# Patient Record
Sex: Male | Born: 1935 | Hispanic: No | Smoking: Former smoker
Health system: Southern US, Community
[De-identification: ages and names within clinical notes are randomized; demographics above are authoritative.]

## PROBLEM LIST (undated history)

## (undated) DIAGNOSIS — I10 Essential (primary) hypertension: Secondary | ICD-10-CM

## (undated) DIAGNOSIS — E785 Hyperlipidemia, unspecified: Secondary | ICD-10-CM

## (undated) DIAGNOSIS — M1990 Unspecified osteoarthritis, unspecified site: Secondary | ICD-10-CM

## (undated) DIAGNOSIS — K219 Gastro-esophageal reflux disease without esophagitis: Secondary | ICD-10-CM

## (undated) DIAGNOSIS — I739 Peripheral vascular disease, unspecified: Secondary | ICD-10-CM

## (undated) DIAGNOSIS — I6529 Occlusion and stenosis of unspecified carotid artery: Secondary | ICD-10-CM

## (undated) DIAGNOSIS — Z8673 Personal history of transient ischemic attack (TIA), and cerebral infarction without residual deficits: Secondary | ICD-10-CM

## (undated) HISTORY — DX: Gastro-esophageal reflux disease without esophagitis: K21.9

## (undated) HISTORY — DX: Hyperlipidemia, unspecified: E78.5

## (undated) HISTORY — DX: Essential (primary) hypertension: I10

## (undated) HISTORY — DX: Peripheral vascular disease, unspecified: I73.9

## (undated) HISTORY — DX: Personal history of transient ischemic attack (TIA), and cerebral infarction without residual deficits: Z86.73

## (undated) HISTORY — PX: APPENDECTOMY: SHX54

## (undated) HISTORY — DX: Occlusion and stenosis of unspecified carotid artery: I65.29

## (undated) HISTORY — PX: EYE SURGERY: SHX253

---

## 2006-02-18 HISTORY — PX: US ECHOCARDIOGRAPHY: HXRAD669

## 2006-06-07 HISTORY — PX: CARDIOVASCULAR STRESS TEST: SHX262

## 2008-01-03 ENCOUNTER — Ambulatory Visit: Payer: Self-pay | Admitting: Vascular Surgery

## 2009-01-10 ENCOUNTER — Ambulatory Visit: Payer: Self-pay | Admitting: Vascular Surgery

## 2009-07-11 ENCOUNTER — Ambulatory Visit: Payer: Self-pay | Admitting: Vascular Surgery

## 2010-07-16 ENCOUNTER — Other Ambulatory Visit (INDEPENDENT_AMBULATORY_CARE_PROVIDER_SITE_OTHER): Payer: Medicare Other

## 2010-07-16 DIAGNOSIS — I6529 Occlusion and stenosis of unspecified carotid artery: Secondary | ICD-10-CM

## 2010-07-23 NOTE — Procedures (Unsigned)
CAROTID DUPLEX EXAM  INDICATION:  Follow up right carotid disease, known left ICA occlusion.  HISTORY: Diabetes:  No. Cardiac:  No. Hypertension:  Yes. Smoking:  No. Previous Surgery:  No. CV History:  No. Amaurosis Fugax No, Paresthesias No, Hemiparesis No                                      RIGHT             LEFT Brachial systolic pressure:         128               132 Brachial Doppler waveforms:         WNL               WNL Vertebral direction of flow:        Antegrade DUPLEX VELOCITIES (cm/sec) CCA peak systolic                   133 ECA peak systolic                   204 ICA peak systolic                   90 ICA end diastolic                   25 PLAQUE MORPHOLOGY:                  Heterogenous PLAQUE AMOUNT:                      Mild PLAQUE LOCATION:                    ICA/ECA  IMPRESSION: 1. 1-39% right ICA plaquing. 2. Antegrade right vertebral artery. 3. Abnormal right ECA. 4. Left side is not evaluated due to known occlusion. 5. Stable velocities compared to prior exam.  ___________________________________________ Di Kindle. Edilia Bo, M.D.  LT/MEDQ  D:  07/16/2010  T:  07/16/2010  Job:  [id number]

## 2010-09-30 NOTE — Assessment & Plan Note (Signed)
OFFICE VISIT   BRAXTEN, MEMMER  DOB:  12-Aug-1935                                       01/03/2008  ZOXWR#:[id number]   I saw the patient in the office today for continued followup of his  carotid disease.  I had originally seen him back in November of 2007  with an occluded left internal carotid artery and no significant disease  on the right.  He had a followup carotid duplex scan today which shows  again an occluded left internal carotid artery and 20-39% right internal  carotid artery stenosis.  Both vertebral arteries are patent with  normally directed flow and his arm pressures are equal.   He has had no history of stroke, TIAs, expressive or receptive aphasia,  or amaurosis fugax.   PAST MEDICAL HISTORY:  Has not changed since I saw him in 2007.  He does  have a history of hypertension and hypercholesterolemia.   REVIEW OF SYSTEMS:  He has had no recent chest pain, chest pressure,  palpitations or arrhythmias.  He had no bronchitis, asthma or wheezing.   He quit tobacco in 1985.   PHYSICAL EXAMINATION:  This is a pleasant 75 year old gentleman who  appears his stated age.  Blood pressure 139/76, heart rate is 79.  I do  not detect any carotid bruits.  Lungs are clear bilaterally to  auscultation.  On cardiac exam, he has a regular rate and rhythm.  Abdomen:  Soft and nontender.  Neurologic exam is nonfocal.   I have again explained we would not consider right carotid  endarterectomy unless this became symptomatic or the stenosis progressed  to greater than 80%.  I have recommend a followup duplex scan in 1 year.  I will see him at that time.  He knows to call sooner if he has  problems.  In the meantime he remains on aspirin.   Di Kindle. Edilia Bo, M.D.  Electronically Signed   CSD/MEDQ  D:  01/03/2008  T:  01/04/2008  Job:  [id number]   cc:   Elmore Guise., M.D.

## 2010-09-30 NOTE — Procedures (Signed)
CAROTID DUPLEX EXAM   INDICATION:  Follow up known carotid artery disease.   HISTORY:  Diabetes:  No  Cardiac:  No  Hypertension:  Yes  Smoking:  No  Previous Surgery:  No  CV History:  No  Amaurosis Fugax No, Paresthesias No, Hemiparesis No                                       RIGHT             LEFT  Brachial systolic pressure:         150               140  Brachial Doppler waveforms:         biphasic          biphasic  Vertebral direction of flow:        antegrade         antegrade  DUPLEX VELOCITIES (cm/sec)  CCA peak systolic                   110               91  ECA peak systolic                   216               139  ICA peak systolic                   147               occluded  ICA end diastolic                   33                occluded  PLAQUE MORPHOLOGY:                  calcified         calcified  PLAQUE AMOUNT:                      moderate          severe  PLAQUE LOCATION:                    ICA and ECA       ICA and ECA   IMPRESSION:  1. Occluded left internal carotid artery.  2. A 40-59% stenosis noted in the right internal carotid artery.  3. Antegrade bilateral vertebral arteries.   ___________________________________________  Di Kindle. Edilia Bo, M.D.   MG/MEDQ  D:  01/10/2009  T:  01/11/2009  Job:  [id number]

## 2010-09-30 NOTE — Assessment & Plan Note (Signed)
OFFICE VISIT   Jeffery Suarez, Jeffery Suarez  DOB:  1935-06-07                                       01/10/2009  ZOXWR#:[id number]   I saw the patient in the office today for continued follow-up of his  carotid disease.  I had originally seen him back in 2007 with an  occluded left internal carotid artery.  I have been following a moderate  right carotid stenosis.  Since I saw him last a year ago he denies any  history of stroke, TIAs, expressive or receptive aphasia, or amaurosis  fugax.   On review of systems, he has had no recent chest pain, chest pressure,  palpitations or arrhythmias.  He has had no productive cough,  bronchitis, asthma or wheezing.  Review of systems is otherwise  documented on the medical history form in his chart.   On physical examination, this is a pleasant 75 year old gentleman who  appears his stated age, blood pressure is 143/78, heart rate is 74.  I  do not detect any carotid bruits.  Lungs:  Are clear bilaterally to  auscultation.  On cardiac exam, he has a regular rate and rhythm.  Neurologic exam:  Is nonfocal.   His carotid duplex scan shows that he has a known left internal carotid  artery occlusion.  On the right side he has a 40% to 59% stenosis.  The  velocities on the right have increased slightly compared to the study a  year ago.  Both vertebral arteries are patent with normally-directed  flow.   Given that the velocities have increased on the right and given the left  internal carotid artery occlusion, I think it would be safest to check  his carotid duplex again in 6 months.  I will plan on seeing him back at  that time.  He knows to call sooner if he has problems.  In the  meantime, he knows to continue taking his aspirin.   Di Kindle. Edilia Bo, M.D.  Electronically Signed   CSD/MEDQ  D:  01/10/2009  T:  01/11/2009  Job:  [id number]

## 2010-09-30 NOTE — Procedures (Signed)
CAROTID DUPLEX EXAM   INDICATION:  Follow-up evaluation of known carotid artery disease.   HISTORY:  Diabetes:  No.  Cardiac:  No.  Hypertension:  Yes.  Smoking:  No.  Previous Surgery:  No.  CV History:  Patient had a TIA characterized by loss of vision in her  left eye in 2007.  Previous duplex performed 03/08/06 revealed occluded  left internal carotid arteries and 1-39% right internal carotid artery  stenosis.  Amaurosis Fugax No, Paresthesias No hemiparesis No.                                       RIGHT             LEFT  Brachial systolic pressure:         146               146  Brachial Doppler waveforms:         Triphasic         Triphasic  Vertebral direction of flow:        Antegrade         Antegrade  DUPLEX VELOCITIES (cm/sec)  CCA peak systolic                   88                61  ECA peak systolic                   211               128  ICA peak systolic                   89                Occluded  ICA end diastolic                   23                Occluded  PLAQUE MORPHOLOGY:                  Calcified         Mixed  PLAQUE AMOUNT:                      Mild              Severe  PLAQUE LOCATION:                    Proximal ICA      Throughout ICA   IMPRESSION:  1. 20-39% right internal carotid artery stenosis.  2. Occluded left internal carotid artery.  3. No significant change from previous study performed on 03/08/06.   ___________________________________________  Di Kindle. Edilia Bo, M.D.   MC/MEDQ  D:  01/03/2008  T:  01/03/2008  Job:  [id number]

## 2010-09-30 NOTE — Procedures (Signed)
CAROTID DUPLEX EXAM   INDICATION:  Followup known carotid artery disease.   HISTORY:  Diabetes:  No.  Cardiac:  No.  Hypertension:  Yes.  Smoking:  No.  Previous Surgery:  No.  CV History:  No.  Amaurosis Fugax No, Paresthesias No, Hemiparesis No                                       RIGHT             LEFT  Brachial systolic pressure:         130               130  Brachial Doppler waveforms:         Biphasic          Biphasic  Vertebral direction of flow:        Antegrade         Antegrade  DUPLEX VELOCITIES (cm/sec)  CCA peak systolic                   122               121  ECA peak systolic                   297               143  ICA peak systolic                   131               Occluded  ICA end diastolic                   33                Occluded  PLAQUE MORPHOLOGY:                  Calcified         Calcified  PLAQUE AMOUNT:                      Mild              Severe  PLAQUE LOCATION:                    ICA and ECA       ICA and ECA   IMPRESSION:  1. Known left internal carotid artery occlusion.  2. 40%-59% stenosis noted in the right internal carotid artery.  3. Antegrade bilateral vertebrals.   ___________________________________________  Di Kindle. Edilia Bo, M.D.   MG/MEDQ  D:  07/11/2009  T:  07/11/2009  Job:  [id number]

## 2010-10-28 ENCOUNTER — Other Ambulatory Visit: Payer: Self-pay | Admitting: Cardiovascular Disease

## 2010-10-28 DIAGNOSIS — E785 Hyperlipidemia, unspecified: Secondary | ICD-10-CM

## 2010-10-28 NOTE — Telephone Encounter (Signed)
Fax received from pharmacy. Refill completed. Jodette Jarion Hawthorne RN  

## 2010-10-29 ENCOUNTER — Other Ambulatory Visit: Payer: Self-pay | Admitting: Cardiovascular Disease

## 2010-10-29 NOTE — Telephone Encounter (Signed)
Needs yearly soon, noted on refill #90 no refill

## 2010-12-09 ENCOUNTER — Encounter: Payer: Self-pay | Admitting: Cardiovascular Disease

## 2010-12-11 ENCOUNTER — Other Ambulatory Visit: Payer: Self-pay | Admitting: *Deleted

## 2010-12-11 ENCOUNTER — Encounter: Payer: Self-pay | Admitting: Cardiovascular Disease

## 2010-12-11 ENCOUNTER — Ambulatory Visit (INDEPENDENT_AMBULATORY_CARE_PROVIDER_SITE_OTHER): Payer: Medicare Other | Admitting: Cardiovascular Disease

## 2010-12-11 ENCOUNTER — Other Ambulatory Visit (INDEPENDENT_AMBULATORY_CARE_PROVIDER_SITE_OTHER): Payer: Medicare Other | Admitting: *Deleted

## 2010-12-11 VITALS — BP 152/80 | HR 70 | Ht 70.0 in | Wt 239.2 lb

## 2010-12-11 DIAGNOSIS — E785 Hyperlipidemia, unspecified: Secondary | ICD-10-CM

## 2010-12-11 DIAGNOSIS — G459 Transient cerebral ischemic attack, unspecified: Secondary | ICD-10-CM | POA: Insufficient documentation

## 2010-12-11 DIAGNOSIS — I739 Peripheral vascular disease, unspecified: Secondary | ICD-10-CM | POA: Insufficient documentation

## 2010-12-11 DIAGNOSIS — I10 Essential (primary) hypertension: Secondary | ICD-10-CM

## 2010-12-11 LAB — HEPATIC FUNCTION PANEL
AST: 27 U/L (ref 0–37)
Albumin: 4.6 g/dL (ref 3.5–5.2)

## 2010-12-11 LAB — BASIC METABOLIC PANEL
CO2: 28 mEq/L (ref 19–32)
Chloride: 102 mEq/L (ref 96–112)
Potassium: 4.3 mEq/L (ref 3.5–5.1)
Sodium: 136 mEq/L (ref 135–145)

## 2010-12-11 LAB — LIPID PANEL
HDL: 37.3 mg/dL — ABNORMAL LOW (ref >39.00)
LDL Cholesterol: 50 mg/dL (ref 0–99)
Total CHOL/HDL Ratio: 3
Triglycerides: 176 mg/dL — ABNORMAL HIGH (ref 0.0–149.0)

## 2010-12-11 NOTE — Assessment & Plan Note (Signed)
He'll continue on the same medications. Will check his lipids at his next office visit.

## 2010-12-11 NOTE — Assessment & Plan Note (Signed)
Jeffery Suarez is doing fairly well. His blood pressure is mildly elevated. I've asked him to continue with a good diet and exercise program.

## 2010-12-11 NOTE — Progress Notes (Signed)
Jeffery Suarez Date of Birth  22-Feb-1936 [facility] / [facility] [address]     [address] [place], [place]  [address] [phone]  Fax  [phone]  History of Present Illness:  75 yo with a hx of HTN, PVD, TIA and hyperlipidemia. previoius kersey patient.  No complaints.  Active. No dypsnea or chest pain.  Current Outpatient Prescriptions on File Prior to Visit  Medication Sig Dispense Refill  . aspirin 81 MG tablet Take 81 mg by mouth daily.        . AVALIDE 150-12.5 MG per tablet TAKE 1 TABLET BY MOUTH EVERY DAY  90 tablet  0  . Coenzyme Q10 (COQ10 PO) Take by mouth daily.        . CRESTOR 10 MG tablet TAKE 1 TABLET BY MOUTH AT BEDTIME  90 tablet  0  . fish oil-omega-3 fatty acids 1000 MG capsule Take 1 g by mouth 2 (two) times daily.          Allergies  Allergen Reactions  . Peanut-Containing Drug Products   . Shrimp (Shellfish Allergy)     Past Medical History  Diagnosis Date  . Hypertension   . PVD (peripheral vascular disease)     OCCLUDED LEFT INTERNAL CAROTID ARTERY  . Hx-TIA (transient ischemic attack)   . Hyperlipidemia   . GERD (gastroesophageal reflux disease)     Past Surgical History  Procedure Date  . Appendectomy   . US echocardiography 02/18/2006    EF 55-60%  . Cardiovascular stress test 06/07/2006    EF 63%    History  Smoking status  . Former Smoker  . Quit date: 05/19/1983  Smokeless tobacco  . Not on file    History  Alcohol Use No    Family History  Problem Relation Age of Onset  . Heart attack Brother   . Cancer Sister     Reviw of Systems:  Reviewed in the HPI.  All other systems are negative.  Physical Exam: BP 152/80  Pulse 70  Ht 5\' 10"  (1.778 m)  Wt 239 lb 3.2 oz (108.5 kg)  BMI 34.32 kg/m2 The patient is alert and oriented x 3.  The mood and affect are normal.   Skin: warm and dry.  Color is normal.    HEENT:   the sclera are nonicteric.  The mucous membranes are moist.  The  carotids are 2+ without bruits.  There is no thyromegaly.  There is no JVD.    Lungs: clear.  The chest wall is non tender.    Heart: regular rate with a normal S1 and S2.  There are no murmurs, gallops, or rubs. The PMI is not displaced.     Abdomen: good bowel sounds.  There is no guarding or rebound.  There is no hepatosplenomegaly or tenderness.  There are no masses.   Extremities:  no clubbing, cyanosis, or edema.  The legs are without rashes.  The distal pulses are intact.   Neuro:  Cranial nerves II - XII are intact.  Motor and sensory functions are intact.    The gait is normal.  ECG:  Assessment / Plan:

## 2010-12-11 NOTE — Telephone Encounter (Signed)
Open in error

## 2010-12-12 ENCOUNTER — Encounter: Payer: Self-pay | Admitting: Cardiovascular Disease

## 2010-12-12 ENCOUNTER — Telehealth: Payer: Self-pay | Admitting: *Deleted

## 2010-12-12 NOTE — Telephone Encounter (Signed)
Patient called with lab results. Pt verbalized understanding. Jodette Kayleanna Lorman RN  

## 2010-12-15 ENCOUNTER — Other Ambulatory Visit: Payer: Self-pay | Admitting: Cardiovascular Disease

## 2010-12-15 MED ORDER — IRBESARTAN-HYDROCHLOROTHIAZIDE 150-12.5 MG PO TABS: ORAL_TABLET | ORAL | Status: DC

## 2010-12-15 MED ORDER — ROSUVASTATIN CALCIUM 10 MG PO TABS: 10.0000 mg | ORAL_TABLET | Freq: Every day | ORAL | Status: DC

## 2010-12-15 NOTE — Telephone Encounter (Signed)
escribe medication per fax request  

## 2011-07-21 ENCOUNTER — Encounter: Payer: Self-pay | Admitting: Vascular Surgery

## 2011-07-22 ENCOUNTER — Ambulatory Visit (INDEPENDENT_AMBULATORY_CARE_PROVIDER_SITE_OTHER): Payer: Medicare Other | Admitting: Vascular Surgery

## 2011-07-22 ENCOUNTER — Other Ambulatory Visit (INDEPENDENT_AMBULATORY_CARE_PROVIDER_SITE_OTHER): Payer: Medicare Other | Admitting: *Deleted

## 2011-07-22 ENCOUNTER — Encounter: Payer: Self-pay | Admitting: Vascular Surgery

## 2011-07-22 VITALS — BP 140/73 | HR 85 | Resp 16 | Ht 71.0 in | Wt 242.0 lb

## 2011-07-22 DIAGNOSIS — I6529 Occlusion and stenosis of unspecified carotid artery: Secondary | ICD-10-CM | POA: Insufficient documentation

## 2011-07-22 NOTE — Progress Notes (Signed)
[facility] [place]  Patient name: Jeffery Suarez MRN: [id number] DOB: 23-Jun-1935 Sex: male  REASON FOR VISIT: follow up of carotid disease  HPI: Jeffery Suarez is a 76 y.o. male who I originally seen back in 2007 with an occluded left internal carotid artery. I been following him with a mild right carotid stenosis. He been coming in at one year intervals for follow duplex scan.  The patient denies any history of stroke, TIAs, expressive or receptive aphasia, or amaurosis fugax. His only complaint today has been some ankle pain on the right which is aggravated by activity and relieved with rest. He may have some arthritis in the right ankle.  Past Medical History  Diagnosis Date  . Hypertension   . PVD (peripheral vascular disease)     OCCLUDED LEFT INTERNAL CAROTID ARTERY  . Hx-TIA (transient ischemic attack)   . Hyperlipidemia   . GERD (gastroesophageal reflux disease)     Family History  Problem Relation Age of Onset  . Heart attack Brother   . Cancer Sister     SOCIAL HISTORY: History  Substance Use Topics  . Smoking status: Former Smoker    Quit date: 05/19/1983  . Smokeless tobacco: Not on file  . Alcohol Use: No    Allergies  Allergen Reactions  . Shrimp (Shellfish Allergy)     Current Outpatient Prescriptions  Medication Sig Dispense Refill  . aspirin 81 MG tablet Take 81 mg by mouth daily.        . Coenzyme Q10 (COQ10 PO) Take by mouth daily.        . fish oil-omega-3 fatty acids 1000 MG capsule Take 1 g by mouth 2 (two) times daily.        . irbesartan-hydrochlorothiazide (AVALIDE) 150-12.5 MG per tablet Take one tablet daily  90 tablet  3  . rosuvastatin (CRESTOR) 10 MG tablet Take 1 tablet (10 mg total) by mouth daily.  90 tablet  3    REVIEW OF SYSTEMS: Arly.Keller ] denotes positive finding; [  ] denotes negative finding  CARDIOVASCULAR:  [ ]  chest pain   [ ]  chest pressure   [ ]  palpitations   [ ]  orthopnea   [ ]  dyspnea on exertion   [ ]   claudication   [ ]  rest pain   [ ]  DVT   [ ]  phlebitis PULMONARY:   [ ]  productive cough   [ ]  asthma   [ ]  wheezing NEUROLOGIC:   [ ]  weakness  [ ]  paresthesias  [ ]  aphasia  [ ]  amaurosis  [ ]  dizziness HEMATOLOGIC:   [ ]  bleeding problems   [ ]  clotting disorders MUSCULOSKELETAL:  [ ]  joint pain   [ ]  joint swelling [ ]  leg swelling GASTROINTESTINAL: [ ]   blood in stool  [ ]   hematemesis GENITOURINARY:  [ ]   dysuria  [ ]   hematuria PSYCHIATRIC:  [ ]  history of major depression INTEGUMENTARY:  [ ]  rashes  [ ]  ulcers CONSTITUTIONAL:  [ ]  fever   [ ]  chills  PHYSICAL EXAM: Filed Vitals:   07/22/11 1354 07/22/11 1357  BP: 146/81 140/73  Pulse: 85 85  Resp: 16   Height: 5\' 11"  (1.803 m)   Weight: 242 lb (109.77 kg)   SpO2: 98% 97%   Body mass index is 33.75 kg/(m^2). GENERAL: The patient is a well-nourished male, in no acute distress. The vital signs are documented above. CARDIOVASCULAR: There is a regular rate and rhythm without significant  murmur appreciated. I do not detect carotid bruits. PULMONARY: There is good air exchange bilaterally without wheezing or rales. ABDOMEN: Soft and non-tender with normal pitched bowel sounds.  MUSCULOSKELETAL: There are no major deformities or cyanosis. NEUROLOGIC: No focal weakness or paresthesias are detected. SKIN: There are no ulcers or rashes noted. PSYCHIATRIC: The patient has a normal affect.  DATA:  I have independently interpreted his carotid duplex scan which shows no evidence of significant carotid stenosis on the right. He has a known left internal carotid artery occlusion.  MEDICAL ISSUES: Given that he has the left internal carotid artery occlusion I think we should continue to obtain yearly duplex scans to fall the right side. He is in agreement with that. I've ordered a fall carotid duplex scan in 1 year now see him back at that time. He knows to call sooner if he has problems. In the meantime he knows to continue taking his  aspirin.  Mazal Ebey S [facility] [place] [facility]: [phone]

## 2011-08-05 NOTE — Procedures (Unsigned)
CAROTID DUPLEX EXAM  INDICATION:  Follow up right carotid disease, known left ICA occlusion.  HISTORY: Diabetes:  no Cardiac:  no Hypertension:  yes Smoking:  no Previous Surgery:  no CV History: Amaurosis Fugax No, Paresthesias No, Hemiparesis No                                      RIGHT             LEFT Brachial systolic pressure:         122               142 Brachial Doppler waveforms:         wnl               wnl Vertebral direction of flow:        antegrade DUPLEX VELOCITIES (cm/sec) CCA peak systolic                   89 ECA peak systolic                   220 ICA peak systolic                   91 ICA end diastolic                   25 PLAQUE MORPHOLOGY:                  heterogeneous PLAQUE AMOUNT:                      mild PLAQUE LOCATION:                    ICA/ECA  IMPRESSION: 1. One to 39% right internal carotid artery plaquing. 2. Right external carotid artery is abnormal. 3. Right vertebral artery is within normal limits. 4. Right subclavian artery appears within normal limits.  ___________________________________________ Di Kindle. Edilia Bo, M.D.  LT/MEDQ  D:  07/23/2011  T:  07/23/2011  Job:  [id number]

## 2012-07-19 ENCOUNTER — Encounter: Payer: Self-pay | Admitting: Neurosurgery

## 2012-07-20 ENCOUNTER — Ambulatory Visit (INDEPENDENT_AMBULATORY_CARE_PROVIDER_SITE_OTHER): Payer: Medicare Other | Admitting: Neurosurgery

## 2012-07-20 ENCOUNTER — Encounter: Payer: Self-pay | Admitting: Neurosurgery

## 2012-07-20 ENCOUNTER — Other Ambulatory Visit (INDEPENDENT_AMBULATORY_CARE_PROVIDER_SITE_OTHER): Payer: Medicare Other

## 2012-07-20 DIAGNOSIS — I6529 Occlusion and stenosis of unspecified carotid artery: Secondary | ICD-10-CM

## 2012-07-20 NOTE — Progress Notes (Signed)
[facility] [place] Carotid Office Note  CC: Carotid surveillance Referring Physician: Edilia Bo  History of Present Illness: 77 year old male patient of Dr. Edilia Bo followed for right ICA stenosis in the left ICA occlusion. The patient denies any signs or symptoms of CVA, TIA, amaurosis fugax or any neural deficit. The patient denies any new medical diagnoses or recent surgery.  Past Medical History  Diagnosis Date  . Hypertension   . PVD (peripheral vascular disease)     OCCLUDED LEFT INTERNAL CAROTID ARTERY  . Hx-TIA (transient ischemic attack)   . Hyperlipidemia   . GERD (gastroesophageal reflux disease)   . Carotid artery occlusion     ROS: [x]  Positive   [ ]  Denies    General: [ ]  Weight loss, [ ]  Fever, [ ]  chills Neurologic: [ ]  Dizziness, [ ]  Blackouts, [ ]  Seizure [ ]  Stroke, [ ]  "Mini stroke", [ ]  Slurred speech, [ ]  Temporary blindness; [ ]  weakness in arms or legs, [ ]  Hoarseness Cardiac: [ ]  Chest pain/pressure, [ ]  Shortness of breath at rest [ ]  Shortness of breath with exertion, [ ]  Atrial fibrillation or irregular heartbeat Vascular: [ ]  Pain in legs with walking, [ ]  Pain in legs at rest, [ ]  Pain in legs at night,  [ ]  Non-healing ulcer, [ ]  Blood clot in vein/DVT,   Pulmonary: [ ]  Home oxygen, [ ]  Productive cough, [ ]  Coughing up blood, [ ]  Asthma,  [ ]  Wheezing Musculoskeletal:  [ ]  Arthritis, [ ]  Low back pain, [ ]  Joint pain Hematologic: [ ]  Easy Bruising, [ ]  Anemia; [ ]  Hepatitis Gastrointestinal: [ ]  Blood in stool, [ ]  Gastroesophageal Reflux/heartburn, [ ]  Trouble swallowing Urinary: [ ]  chronic Kidney disease, [ ]  on HD - [ ]  MWF or [ ]  TTHS, [ ]  Burning with urination, [ ]  Difficulty urinating Skin: [ ]  Rashes, [ ]  Wounds Psychological: [ ]  Anxiety, [ ]  Depression   Social History History  Substance Use Topics  . Smoking status: Former Smoker    Quit date: 05/19/1983  . Smokeless tobacco: Never Used  . Alcohol Use: No     Family History Family History  Problem Relation Age of Onset  . Heart attack Brother   . Cancer Sister   . Hypertension Mother     Allergies  Allergen Reactions  . Peanuts (Peanut Oil) Diarrhea  . Shrimp (Shellfish Allergy)     Current Outpatient Prescriptions  Medication Sig Dispense Refill  . aspirin 81 MG tablet Take 81 mg by mouth daily.        . Coenzyme Q10 (COQ10 PO) Take by mouth daily.        . fish oil-omega-3 fatty acids 1000 MG capsule Take 1 g by mouth 2 (two) times daily.        . irbesartan-hydrochlorothiazide (AVALIDE) 150-12.5 MG per tablet Take one tablet daily  90 tablet  3  . rosuvastatin (CRESTOR) 10 MG tablet Take 1 tablet (10 mg total) by mouth daily.  90 tablet  3   No current facility-administered medications for this visit.    Physical Examination  Filed Vitals:   07/20/12 1339  BP: 130/79  Pulse: 87  Resp:     Body mass index is 33.66 kg/(m^2).  General:  WDWN in NAD Gait: Normal HEENT: WNL Eyes: Pupils equal Pulmonary: normal non-labored breathing , without Rales, rhonchi,  wheezing Cardiac: RRR, without  Murmurs, rubs or gallops; Abdomen: soft, NT, no masses Skin: no  rashes, ulcers noted  Vascular Exam Pulses: 3+ radial pulses bilaterally Carotid bruits: Carotid pulse heard on the right with a known left occlusion Extremities without ischemic changes, no Gangrene , no cellulitis; no open wounds;  Musculoskeletal: no muscle wasting or atrophy   Neurologic: A&O X 3; Appropriate Affect ; SENSATION: normal; MOTOR FUNCTION:  moving all extremities equally. Speech is fluent/normal  Non-Invasive Vascular Imaging CAROTID DUPLEX 07/20/2012  Right ICA 20 - 39 % stenosis Left ICA 0ccluded stenosis   ASSESSMENT/PLAN: Asymptomatic patient with stable carotid duplex from one year ago. The patient will followup in one year with repeat carotid duplex. The patient's questions were encouraged and answered, he is in agreement with this  plan.  Lauree Chandler ANP   Clinic MD: Edilia Bo

## 2012-07-20 NOTE — Addendum Note (Signed)
Addended by: Sharee Pimple on: 07/20/2012 02:29 PM   Modules accepted: Orders

## 2012-08-17 ENCOUNTER — Encounter: Payer: Self-pay | Admitting: *Deleted

## 2013-06-16 ENCOUNTER — Other Ambulatory Visit: Payer: Self-pay | Admitting: Neurosurgery

## 2013-06-16 DIAGNOSIS — I6529 Occlusion and stenosis of unspecified carotid artery: Secondary | ICD-10-CM

## 2013-07-26 ENCOUNTER — Other Ambulatory Visit: Payer: TRICARE For Life (TFL)

## 2013-07-26 ENCOUNTER — Ambulatory Visit: Payer: TRICARE For Life (TFL) | Admitting: Family

## 2013-07-26 ENCOUNTER — Other Ambulatory Visit (HOSPITAL_COMMUNITY): Payer: TRICARE For Life (TFL)

## 2016-08-12 DIAGNOSIS — Z23 Encounter for immunization: Secondary | ICD-10-CM | POA: Diagnosis not present

## 2016-08-12 DIAGNOSIS — R739 Hyperglycemia, unspecified: Secondary | ICD-10-CM | POA: Diagnosis not present

## 2016-08-12 DIAGNOSIS — Z0000 Encounter for general adult medical examination without abnormal findings: Secondary | ICD-10-CM | POA: Diagnosis not present

## 2016-08-12 DIAGNOSIS — E7800 Pure hypercholesterolemia, unspecified: Secondary | ICD-10-CM | POA: Diagnosis not present

## 2016-08-12 DIAGNOSIS — I10 Essential (primary) hypertension: Secondary | ICD-10-CM | POA: Diagnosis not present

## 2017-02-10 DIAGNOSIS — B351 Tinea unguium: Secondary | ICD-10-CM | POA: Diagnosis not present

## 2017-02-10 DIAGNOSIS — R739 Hyperglycemia, unspecified: Secondary | ICD-10-CM | POA: Diagnosis not present

## 2017-02-10 DIAGNOSIS — Z23 Encounter for immunization: Secondary | ICD-10-CM | POA: Diagnosis not present

## 2017-02-10 DIAGNOSIS — I10 Essential (primary) hypertension: Secondary | ICD-10-CM | POA: Diagnosis not present

## 2017-02-10 DIAGNOSIS — E785 Hyperlipidemia, unspecified: Secondary | ICD-10-CM | POA: Diagnosis not present

## 2017-02-10 DIAGNOSIS — E7800 Pure hypercholesterolemia, unspecified: Secondary | ICD-10-CM | POA: Diagnosis not present

## 2017-02-10 DIAGNOSIS — M25552 Pain in left hip: Secondary | ICD-10-CM | POA: Diagnosis not present

## 2017-08-09 DIAGNOSIS — R739 Hyperglycemia, unspecified: Secondary | ICD-10-CM | POA: Diagnosis not present

## 2017-08-09 DIAGNOSIS — S40021A Contusion of right upper arm, initial encounter: Secondary | ICD-10-CM | POA: Diagnosis not present

## 2017-08-09 DIAGNOSIS — E7800 Pure hypercholesterolemia, unspecified: Secondary | ICD-10-CM | POA: Diagnosis not present

## 2017-08-09 DIAGNOSIS — M25552 Pain in left hip: Secondary | ICD-10-CM | POA: Diagnosis not present

## 2017-08-09 DIAGNOSIS — E785 Hyperlipidemia, unspecified: Secondary | ICD-10-CM | POA: Diagnosis not present

## 2017-08-09 DIAGNOSIS — I10 Essential (primary) hypertension: Secondary | ICD-10-CM | POA: Diagnosis not present

## 2017-08-09 DIAGNOSIS — Z0000 Encounter for general adult medical examination without abnormal findings: Secondary | ICD-10-CM | POA: Diagnosis not present

## 2018-03-18 ENCOUNTER — Other Ambulatory Visit: Payer: Self-pay | Admitting: Family

## 2018-03-18 ENCOUNTER — Telehealth: Payer: Self-pay | Admitting: *Deleted

## 2018-03-18 ENCOUNTER — Ambulatory Visit (INDEPENDENT_AMBULATORY_CARE_PROVIDER_SITE_OTHER): Payer: Medicare Other | Admitting: Family

## 2018-03-18 ENCOUNTER — Ambulatory Visit (INDEPENDENT_AMBULATORY_CARE_PROVIDER_SITE_OTHER): Payer: Medicare Other

## 2018-03-18 ENCOUNTER — Encounter: Payer: Self-pay | Admitting: Family

## 2018-03-18 ENCOUNTER — Encounter: Payer: Self-pay | Admitting: *Deleted

## 2018-03-18 VITALS — BP 169/72 | HR 72 | Temp 96.8°F | Ht 70.5 in | Wt 243.0 lb

## 2018-03-18 DIAGNOSIS — M25552 Pain in left hip: Secondary | ICD-10-CM | POA: Diagnosis not present

## 2018-03-18 DIAGNOSIS — Z96642 Presence of left artificial hip joint: Secondary | ICD-10-CM | POA: Diagnosis not present

## 2018-03-18 DIAGNOSIS — Z23 Encounter for immunization: Secondary | ICD-10-CM | POA: Diagnosis not present

## 2018-03-18 DIAGNOSIS — I10 Essential (primary) hypertension: Secondary | ICD-10-CM | POA: Diagnosis not present

## 2018-03-18 DIAGNOSIS — Z471 Aftercare following joint replacement surgery: Secondary | ICD-10-CM | POA: Diagnosis not present

## 2018-03-18 DIAGNOSIS — M949 Disorder of cartilage, unspecified: Principal | ICD-10-CM

## 2018-03-18 DIAGNOSIS — I161 Hypertensive emergency: Secondary | ICD-10-CM

## 2018-03-18 DIAGNOSIS — E669 Obesity, unspecified: Secondary | ICD-10-CM | POA: Diagnosis not present

## 2018-03-18 DIAGNOSIS — E785 Hyperlipidemia, unspecified: Secondary | ICD-10-CM | POA: Diagnosis not present

## 2018-03-18 DIAGNOSIS — M899 Disorder of bone, unspecified: Secondary | ICD-10-CM

## 2018-03-18 DIAGNOSIS — I739 Peripheral vascular disease, unspecified: Secondary | ICD-10-CM | POA: Diagnosis not present

## 2018-03-18 MED ORDER — IRBESARTAN 150 MG PO TABS: 150.0000 mg | ORAL_TABLET | Freq: Every day | ORAL | 1 refills | Status: DC

## 2018-03-18 MED ORDER — IRBESARTAN-HYDROCHLOROTHIAZIDE 150-12.5 MG PO TABS: ORAL_TABLET | ORAL | 3 refills | Status: DC

## 2018-03-18 MED ORDER — HYDROCHLOROTHIAZIDE 12.5 MG PO TABS: 12.5000 mg | ORAL_TABLET | Freq: Every day | ORAL | 3 refills | Status: DC

## 2018-03-18 MED ORDER — ROSUVASTATIN CALCIUM 10 MG PO TABS: 10.0000 mg | ORAL_TABLET | Freq: Every day | ORAL | 3 refills | Status: DC

## 2018-03-18 MED ORDER — CLONIDINE HCL 0.1 MG PO TABS
0.2000 mg | ORAL_TABLET | Freq: Once | ORAL | Status: AC
  Administered 2018-03-18: 0.2 mg via ORAL

## 2018-03-18 NOTE — Telephone Encounter (Signed)
Avalide  Is not available at [facility] pharmacy,  on back order. Could you send separate scripts?

## 2018-03-18 NOTE — Patient Instructions (Signed)

## 2018-03-18 NOTE — Progress Notes (Signed)
Subjective:    Patient ID: Jeffery Suarez, male    DOB: 12/19/1935, 82 y.o.   MRN: [id number]  Chief Complaint  Patient presents with  . New Patient (Initial Visit)   PT presents to the office today to establish care. He states he has not taken any of his medications since June .  Hypertension  This is a chronic problem. The current episode started more than 1 year ago. The problem is uncontrolled. Pertinent negatives include no chest pain, headaches, malaise/fatigue, peripheral edema or shortness of breath. Risk factors for coronary artery disease include dyslipidemia, obesity, male gender and sedentary lifestyle. Hypertensive end-organ damage includes CAD/MI, CVA and PVD.  Hyperlipidemia  This is a chronic problem. The current episode started more than 1 year ago. The problem is uncontrolled. Recent lipid tests were reviewed and are high. Exacerbating diseases include obesity. Pertinent negatives include no chest pain or shortness of breath. He is currently on no antihyperlipidemic treatment. The current treatment provides no improvement of lipids. Risk factors for coronary artery disease include dyslipidemia, hypertension and male sex.  Hip Pain   The incident occurred more than 1 week ago. There was no injury mechanism. The pain is present in the left hip. The pain is at a severity of 8/10. The pain is moderate. The pain has been constant since onset. He reports no foreign bodies present. The symptoms are aggravated by movement and weight bearing. He has tried NSAIDs for the symptoms. The treatment provided mild relief.      Review of Systems  Constitutional: Negative for malaise/fatigue.  Respiratory: Negative for shortness of breath.   Cardiovascular: Negative for chest pain.  Neurological: Negative for headaches.  All other systems reviewed and are negative.  Family History  Problem Relation Age of Onset  . Heart attack Brother   . Cancer Sister   . Hypertension Mother      Social History   Socioeconomic History  . Marital status: Married    Spouse name: Not on file  . Number of children: Not on file  . Years of education: Not on file  . Highest education level: Not on file  Occupational History  . Not on file  Social Needs  . Financial resource strain: Not on file  . Food insecurity:    Worry: Not on file    Inability: Not on file  . Transportation needs:    Medical: Not on file    Non-medical: Not on file  Tobacco Use  . Smoking status: Former Smoker    Last attempt to quit: 05/19/1983    Years since quitting: 34.8  . Smokeless tobacco: Never Used  Substance and Sexual Activity  . Alcohol use: No  . Drug use: No  . Sexual activity: Not on file  Lifestyle  . Physical activity:    Days per week: Not on file    Minutes per session: Not on file  . Stress: Not on file  Relationships  . Social connections:    Talks on phone: Not on file    Gets together: Not on file    Attends religious service: Not on file    Active member of club or organization: Not on file    Attends meetings of clubs or organizations: Not on file    Relationship status: Not on file  Other Topics Concern  . Not on file  Social History Narrative  . Not on file       Objective:   Physical Exam  Constitutional: He is oriented to person, place, and time. He appears well-developed and well-nourished. No distress.  HENT:  Head: Normocephalic.  Right Ear: External ear normal.  Left Ear: External ear normal.  Mouth/Throat: Oropharynx is clear and moist.  Eyes: Pupils are equal, round, and reactive to light. Right eye exhibits no discharge. Left eye exhibits no discharge.  Neck: Normal range of motion. Neck supple. No thyromegaly present.  Cardiovascular: Normal rate, regular rhythm, normal heart sounds and intact distal pulses.  No murmur heard. Pulmonary/Chest: Effort normal and breath sounds normal. No respiratory distress. He has no wheezes.  Abdominal: Soft.  Bowel sounds are normal. He exhibits no distension. There is no tenderness.  Musculoskeletal: Normal range of motion. He exhibits no edema or tenderness.  Neurological: He is alert and oriented to person, place, and time. He has normal reflexes. No cranial nerve deficit.  Skin: Skin is warm and dry. No rash noted. No erythema.  Psychiatric: He has a normal mood and affect. His behavior is normal. Judgment and thought content normal.  Vitals reviewed.     BP (!) 215/109   Pulse 74   Temp (!) 96.8 F (36 C) (Oral)   Ht 5' 10.5" (1.791 m)   Wt 243 lb (110.2 kg)   BMI 34.37 kg/m      Assessment & Plan:  Jeffery Suarez comes in today with chief complaint of New Patient (Initial Visit)   Diagnosis and orders addressed:  1. Essential hypertension We will restart medication today BP improved after clonidine  - CMP14+EGFR - CBC with Differential/Platelet - irbesartan-hydrochlorothiazide (AVALIDE) 150-12.5 MG tablet; Take one tablet daily  Dispense: 90 tablet; Refill: 3  2. Peripheral vascular disease (HCC) - CMP14+EGFR - CBC with Differential/Platelet - rosuvastatin (CRESTOR) 10 MG tablet; Take 1 tablet (10 mg total) by mouth daily.  Dispense: 90 tablet; Refill: 3  3. Hyperlipidemia, unspecified hyperlipidemia type - CMP14+EGFR - CBC with Differential/Platelet - Lipid panel - rosuvastatin (CRESTOR) 10 MG tablet; Take 1 tablet (10 mg total) by mouth daily.  Dispense: 90 tablet; Refill: 3  4. Obesity (BMI 30-39.9) - CMP14+EGFR - CBC with Differential/Platelet  5. Left hip pain - CMP14+EGFR - CBC with Differential/Platelet - DG HIP UNILAT W OR W/O PELVIS 2-3 VIEWS LEFT; Future  6. Hypertensive emergency - cloNIDine (CATAPRES) tablet 0.2 mg  7. Encounter for immunization - Flu vaccine HIGH DOSE PF   Labs pending Health Maintenance reviewed Diet and exercise encouraged  Follow up plan: 1 week to recheck HTN   Evelina Dun, FNP

## 2018-03-18 NOTE — Telephone Encounter (Signed)
Provider informed patient of hip x-ray results.

## 2018-03-19 LAB — CBC WITH DIFFERENTIAL/PLATELET
BASOS ABS: 0 10*3/uL (ref 0.0–0.2)
Basos: 1 %
EOS (ABSOLUTE): 0.1 10*3/uL (ref 0.0–0.4)
Eos: 2 %
Hematocrit: 38.3 % (ref 37.5–51.0)
Hemoglobin: 12.9 g/dL — ABNORMAL LOW (ref 13.0–17.7)
IMMATURE GRANS (ABS): 0 10*3/uL (ref 0.0–0.1)
IMMATURE GRANULOCYTES: 0 %
LYMPHS: 26 %
Lymphocytes Absolute: 1.5 10*3/uL (ref 0.7–3.1)
MCH: 29.5 pg (ref 26.6–33.0)
MCHC: 33.7 g/dL (ref 31.5–35.7)
MCV: 87 fL (ref 79–97)
Monocytes Absolute: 0.5 10*3/uL (ref 0.1–0.9)
Monocytes: 9 %
NEUTROS PCT: 62 %
Neutrophils Absolute: 3.5 10*3/uL (ref 1.4–7.0)
PLATELETS: 291 10*3/uL (ref 150–450)
RBC: 4.38 x10E6/uL (ref 4.14–5.80)
RDW: 13.6 % (ref 12.3–15.4)
WBC: 5.6 10*3/uL (ref 3.4–10.8)

## 2018-03-19 LAB — CMP14+EGFR
A/G RATIO: 1.8 (ref 1.2–2.2)
ALT: 29 IU/L (ref 0–44)
AST: 22 IU/L (ref 0–40)
Albumin: 4.6 g/dL (ref 3.5–4.7)
Alkaline Phosphatase: 85 IU/L (ref 39–117)
BILIRUBIN TOTAL: 0.5 mg/dL (ref 0.0–1.2)
BUN/Creatinine Ratio: 18 (ref 10–24)
BUN: 21 mg/dL (ref 8–27)
CHLORIDE: 101 mmol/L (ref 96–106)
CO2: 22 mmol/L (ref 20–29)
Calcium: 9.6 mg/dL (ref 8.6–10.2)
Creatinine, Ser: 1.18 mg/dL (ref 0.76–1.27)
GFR calc Af Amer: 67 mL/min/{1.73_m2} (ref >59)
GFR calc non Af Amer: 58 mL/min/{1.73_m2} — ABNORMAL LOW (ref >59)
GLUCOSE: 90 mg/dL (ref 65–99)
Globulin, Total: 2.5 g/dL (ref 1.5–4.5)
POTASSIUM: 4.3 mmol/L (ref 3.5–5.2)
Sodium: 140 mmol/L (ref 134–144)
Total Protein: 7.1 g/dL (ref 6.0–8.5)

## 2018-03-19 LAB — LIPID PANEL
CHOL/HDL RATIO: 7.7 ratio — AB (ref 0.0–5.0)
CHOLESTEROL TOTAL: 232 mg/dL — AB (ref 100–199)
HDL: 30 mg/dL — AB (ref >39)
LDL Calculated: 154 mg/dL — ABNORMAL HIGH (ref 0–99)
TRIGLYCERIDES: 239 mg/dL — AB (ref 0–149)
VLDL CHOLESTEROL CAL: 48 mg/dL — AB (ref 5–40)

## 2018-03-21 ENCOUNTER — Ambulatory Visit (HOSPITAL_COMMUNITY)
Admission: RE | Admit: 2018-03-21 | Discharge: 2018-03-21 | Disposition: A | Discharge status: Home / Self Care | Payer: Medicare Other | Source: Ambulatory Visit | Attending: Family | Admitting: Family

## 2018-03-21 DIAGNOSIS — M1612 Unilateral primary osteoarthritis, left hip: Secondary | ICD-10-CM | POA: Insufficient documentation

## 2018-03-21 DIAGNOSIS — M949 Disorder of cartilage, unspecified: Secondary | ICD-10-CM

## 2018-03-21 DIAGNOSIS — M899 Disorder of bone, unspecified: Secondary | ICD-10-CM

## 2018-03-21 DIAGNOSIS — M25552 Pain in left hip: Secondary | ICD-10-CM | POA: Diagnosis not present

## 2018-03-21 DIAGNOSIS — M778 Other enthesopathies, not elsewhere classified: Secondary | ICD-10-CM | POA: Insufficient documentation

## 2018-03-21 DIAGNOSIS — M87852 Other osteonecrosis, left femur: Secondary | ICD-10-CM | POA: Insufficient documentation

## 2018-03-21 MED ORDER — GADOBUTROL 1 MMOL/ML IV SOLN
10.0000 mL | Freq: Once | INTRAVENOUS | Status: AC | PRN
  Administered 2018-03-21: 10 mL via INTRAVENOUS

## 2018-03-22 ENCOUNTER — Other Ambulatory Visit: Payer: Self-pay | Admitting: Family

## 2018-03-24 ENCOUNTER — Other Ambulatory Visit: Payer: Self-pay | Admitting: Family

## 2018-03-25 ENCOUNTER — Ambulatory Visit (INDEPENDENT_AMBULATORY_CARE_PROVIDER_SITE_OTHER): Payer: Medicare Other | Admitting: Family

## 2018-03-25 ENCOUNTER — Encounter: Payer: Self-pay | Admitting: Family

## 2018-03-25 VITALS — BP 134/70 | HR 85 | Temp 97.1°F | Ht 70.0 in | Wt 241.0 lb

## 2018-03-25 DIAGNOSIS — M25552 Pain in left hip: Secondary | ICD-10-CM

## 2018-03-25 DIAGNOSIS — E669 Obesity, unspecified: Secondary | ICD-10-CM | POA: Diagnosis not present

## 2018-03-25 DIAGNOSIS — I10 Essential (primary) hypertension: Secondary | ICD-10-CM

## 2018-03-25 DIAGNOSIS — M1612 Unilateral primary osteoarthritis, left hip: Secondary | ICD-10-CM | POA: Diagnosis not present

## 2018-03-25 MED ORDER — TRAMADOL HCL 50 MG PO TABS: 50.0000 mg | ORAL_TABLET | Freq: Two times a day (BID) | ORAL | 2 refills | Status: DC | PRN

## 2018-03-25 MED ORDER — IRBESARTAN-HYDROCHLOROTHIAZIDE 150-12.5 MG PO TABS: ORAL_TABLET | ORAL | 3 refills | Status: DC

## 2018-03-25 NOTE — Patient Instructions (Signed)

## 2018-03-25 NOTE — Progress Notes (Signed)
Subjective:    Patient ID: Jeffery Suarez, male    DOB: 05/06/1936, 82 y.o.   MRN: [id number]  Chief Complaint  Patient presents with  . Hypertension    recheck   Pt presents to the office today to recheck HTN. Pt's BP is at goal. He continues to have left hip pain. He has completed his MRI and has follow up with Ortho next month.  Hypertension  This is a chronic problem. The problem has been resolved since onset. The problem is controlled. Associated symptoms include malaise/fatigue. Pertinent negatives include no headaches, peripheral edema or shortness of breath. Risk factors for coronary artery disease include dyslipidemia, male gender and stress. The current treatment provides moderate improvement.  Arthritis  Presents for follow-up visit. He complains of pain and stiffness. The symptoms have been stable. Affected locations include the left hip. His pain is at a severity of 9/10.      Review of Systems  Constitutional: Positive for malaise/fatigue.  Respiratory: Negative for shortness of breath.   Musculoskeletal: Positive for arthritis and stiffness.  Neurological: Negative for headaches.  All other systems reviewed and are negative.      Objective:   Physical Exam  Constitutional: He is oriented to person, place, and time. He appears well-developed and well-nourished. No distress.  HENT:  Head: Normocephalic.  Eyes: Pupils are equal, round, and reactive to light. Right eye exhibits no discharge. Left eye exhibits no discharge.  Neck: Normal range of motion. Neck supple. No thyromegaly present.  Cardiovascular: Normal rate, regular rhythm, normal heart sounds and intact distal pulses.  No murmur heard. Pulmonary/Chest: Effort normal and breath sounds normal. No respiratory distress. He has no wheezes.  Abdominal: Soft. Bowel sounds are normal. He exhibits no distension. There is no tenderness.  Musculoskeletal: He exhibits tenderness. He exhibits no edema.  Left hip pain  with any movement   Neurological: He is alert and oriented to person, place, and time. He has normal reflexes. No cranial nerve deficit.  Skin: Skin is warm and dry. No rash noted. No erythema.  Psychiatric: He has a normal mood and affect. His behavior is normal. Judgment and thought content normal.  Vitals reviewed.     BP 134/70   Pulse 85   Temp (!) 97.1 F (36.2 C) (Oral)   Ht 5' 10" (1.778 m)   Wt 241 lb (109.3 kg)   BMI 34.58 kg/m      Assessment & Plan:  Jeffery Suarez comes in today with chief complaint of Hypertension (recheck)   Diagnosis and orders addressed:  1. Essential hypertension -Dash diet information given -Exercise encouraged - Stress Management  -Continue current meds - BMP8+EGFR  2. Obesity (BMI 30-39.9) - BMP8+EGFR  3. Left hip pain Keep follow up with Ortho We will start Ultram today Fall prevention discussed - traMADol (ULTRAM) 50 MG tablet; Take 1-2 tablets (50-100 mg total) by mouth every 12 (twelve) hours as needed.  Dispense: 60 tablet; Refill: 2 - BMP8+EGFR  4. Osteoarthritis of left hip, unspecified osteoarthritis type - BMP8+EGFR   Labs pending Diet and exercise encouraged  Follow up plan: 3 month    Evelina Dun, FNP

## 2018-03-26 LAB — BMP8+EGFR
BUN / CREAT RATIO: 17 (ref 10–24)
BUN: 22 mg/dL (ref 8–27)
CO2: 25 mmol/L (ref 20–29)
Calcium: 9.8 mg/dL (ref 8.6–10.2)
Chloride: 97 mmol/L (ref 96–106)
Creatinine, Ser: 1.27 mg/dL (ref 0.76–1.27)
GFR calc Af Amer: 61 mL/min/{1.73_m2} (ref >59)
GFR calc non Af Amer: 53 mL/min/{1.73_m2} — ABNORMAL LOW (ref >59)
GLUCOSE: 97 mg/dL (ref 65–99)
Potassium: 4.2 mmol/L (ref 3.5–5.2)
SODIUM: 137 mmol/L (ref 134–144)

## 2018-04-18 ENCOUNTER — Other Ambulatory Visit: Payer: Self-pay | Admitting: Family

## 2018-04-18 DIAGNOSIS — M25552 Pain in left hip: Secondary | ICD-10-CM

## 2018-05-05 ENCOUNTER — Ambulatory Visit (INDEPENDENT_AMBULATORY_CARE_PROVIDER_SITE_OTHER): Payer: Medicare Other | Admitting: Family

## 2018-05-05 ENCOUNTER — Encounter: Payer: Self-pay | Admitting: Family

## 2018-05-05 VITALS — BP 172/71 | HR 82 | Temp 96.9°F | Ht 70.0 in | Wt 240.0 lb

## 2018-05-05 DIAGNOSIS — E669 Obesity, unspecified: Secondary | ICD-10-CM | POA: Diagnosis not present

## 2018-05-05 DIAGNOSIS — I10 Essential (primary) hypertension: Secondary | ICD-10-CM

## 2018-05-05 DIAGNOSIS — S46212A Strain of muscle, fascia and tendon of other parts of biceps, left arm, initial encounter: Secondary | ICD-10-CM

## 2018-05-05 DIAGNOSIS — M1612 Unilateral primary osteoarthritis, left hip: Secondary | ICD-10-CM

## 2018-05-05 DIAGNOSIS — M87852 Other osteonecrosis, left femur: Secondary | ICD-10-CM | POA: Diagnosis not present

## 2018-05-05 MED ORDER — IRBESARTAN 300 MG PO TABS: 300.0000 mg | ORAL_TABLET | Freq: Every day | ORAL | 1 refills | Status: DC

## 2018-05-05 MED ORDER — HYDROCHLOROTHIAZIDE 12.5 MG PO TABS: 12.5000 mg | ORAL_TABLET | Freq: Every day | ORAL | 3 refills | Status: DC

## 2018-05-05 NOTE — Progress Notes (Signed)
Subjective:    Patient ID: Jeffery Suarez, male    DOB: Jan 14, 1936, 82 y.o.   MRN: [id number]  Chief Complaint  Patient presents with  . Hypertension    recheck  . large lump near left bicep   PT presents to the office today to recheck HTN. PT's BP is not at goal. Pt saw Ortho today and will get an steroid injection and set up for left hip replacement. Pt reports he has constant aching pain of 5-10 out 10 that is worse walking, stairs, and rotating.    PT complaining of mild left bicep pain. States in March he twisted his arm and noticed the area became black and bruised and then he had a large nodule. States he has intermittent pain of 2 out 10. Full ROM, but states he does have slight weakness with lifting. He states he has not tried lifting anything too heavy.  Hypertension  This is a chronic problem. The current episode started more than 1 year ago. The problem has been waxing and waning since onset. The problem is uncontrolled. Associated symptoms include shortness of breath ("when walking"). Pertinent negatives include no peripheral edema. Risk factors for coronary artery disease include obesity and male gender. Past treatments include diuretics and angiotensin blockers. The current treatment provides mild improvement.      Review of Systems  Respiratory: Positive for shortness of breath ("when walking").   All other systems reviewed and are negative.      Objective:   Physical Exam Vitals signs reviewed.  Constitutional:      General: He is not in acute distress.    Appearance: He is well-developed.  HENT:     Head: Normocephalic.     Right Ear: External ear normal.     Left Ear: External ear normal.  Eyes:     General:        Right eye: No discharge.        Left eye: No discharge.     Pupils: Pupils are equal, round, and reactive to light.  Neck:     Musculoskeletal: Normal range of motion and neck supple.     Thyroid: No thyromegaly.  Cardiovascular:     Rate and  Rhythm: Normal rate and regular rhythm.     Heart sounds: Normal heart sounds. No murmur.  Pulmonary:     Effort: Pulmonary effort is normal. No respiratory distress.     Breath sounds: Normal breath sounds. No wheezing.  Abdominal:     General: Bowel sounds are normal. There is no distension.     Palpations: Abdomen is soft.     Tenderness: There is no abdominal tenderness.  Musculoskeletal:        General: No tenderness.     Comments: Left hip pain with standing and rotation, large mass on left bicep.   Skin:    General: Skin is warm and dry.     Findings: No erythema or rash.  Neurological:     Mental Status: He is alert and oriented to person, place, and time.     Cranial Nerves: No cranial nerve deficit.     Deep Tendon Reflexes: Reflexes are normal and symmetric.  Psychiatric:        Behavior: Behavior normal.        Thought Content: Thought content normal.        Judgment: Judgment normal.     BP (!) 172/71   Pulse 82   Temp (!) 96.9 F (36.1 C) (  Oral)   Ht _0  (1.778 m)   Wt 240 lb (108.9 kg)   BMI 34.44 kg/m      Assessment & Plan:  Yusuke Beza comes in today with chief complaint of Hypertension (recheck) and large lump near left bicep   Diagnosis and orders addressed:  1. Essential hypertension We will increase Irbesartan to 300 mg from 150 mg Discussed if he becomes dizzy or if he has hypotension to decrease back to 150 mg -Dash diet information given -Exercise encouraged - Stress Management  -Continue current meds -RTO in 2 weeks - hydrochlorothiazide (HYDRODIURIL) 12.5 MG tablet; Take 1 tablet (12.5 mg total) by mouth daily.  Dispense: 90 tablet; Refill: 3 - irbesartan (AVAPRO) 300 MG tablet; Take 1 tablet (300 mg total) by mouth daily.  Dispense: 90 tablet; Refill: 1 - BMP8+EGFR  2. Obesity (BMI 30-39.9) - BMP8+EGFR  3. Primary osteoarthritis of left hip Keep follow up with ORtho - BMP8+EGFR  4. Tear of left biceps muscle, initial  encounter Discussed since he good ROM and it is not effecting his life at this time, we will hold off on scans until after he gets his hip fixed. He does not wish to have surgery. Avoid heavy lifting.  - BMP8+EGFR   Evelina Dun, FNP

## 2018-05-05 NOTE — Patient Instructions (Signed)

## 2018-05-06 LAB — BMP8+EGFR
BUN/Creatinine Ratio: 19 (ref 10–24)
BUN: 23 mg/dL (ref 8–27)
CALCIUM: 10.4 mg/dL — AB (ref 8.6–10.2)
CO2: 22 mmol/L (ref 20–29)
CREATININE: 1.22 mg/dL (ref 0.76–1.27)
Chloride: 99 mmol/L (ref 96–106)
GFR calc Af Amer: 64 mL/min/{1.73_m2} (ref >59)
GFR, EST NON AFRICAN AMERICAN: 56 mL/min/{1.73_m2} — AB (ref >59)
Glucose: 97 mg/dL (ref 65–99)
POTASSIUM: 4.3 mmol/L (ref 3.5–5.2)
Sodium: 140 mmol/L (ref 134–144)

## 2018-05-23 ENCOUNTER — Encounter: Payer: Self-pay | Admitting: Family

## 2018-05-23 ENCOUNTER — Ambulatory Visit (INDEPENDENT_AMBULATORY_CARE_PROVIDER_SITE_OTHER): Payer: Medicare Other

## 2018-05-23 ENCOUNTER — Telehealth: Payer: Self-pay | Admitting: Family

## 2018-05-23 ENCOUNTER — Ambulatory Visit (INDEPENDENT_AMBULATORY_CARE_PROVIDER_SITE_OTHER): Payer: Medicare Other | Admitting: Family

## 2018-05-23 VITALS — BP 164/67 | HR 75 | Temp 97.2°F | Ht 70.0 in | Wt 245.8 lb

## 2018-05-23 DIAGNOSIS — I10 Essential (primary) hypertension: Secondary | ICD-10-CM | POA: Diagnosis not present

## 2018-05-23 DIAGNOSIS — Z01818 Encounter for other preprocedural examination: Secondary | ICD-10-CM

## 2018-05-23 DIAGNOSIS — E669 Obesity, unspecified: Secondary | ICD-10-CM | POA: Diagnosis not present

## 2018-05-23 DIAGNOSIS — M1612 Unilateral primary osteoarthritis, left hip: Secondary | ICD-10-CM

## 2018-05-23 NOTE — Telephone Encounter (Signed)
PT has called states that he is currently taking --this were they ones that there was confusion on Hydrochlorothiazide 12.5  MG one day Irbesartan 300 MG one day  --he also takes Crestor, Ibuprofen and Mobic

## 2018-05-23 NOTE — Patient Instructions (Signed)

## 2018-05-23 NOTE — Progress Notes (Addendum)
Subjective:    Patient ID: Jeffery Suarez, male    DOB: 07-Jun-1935, 83 y.o.   MRN: [id number]  Chief Complaint  Patient presents with  . pre operative clearance, hypertension recheck   PT presents to the office today to recheck HTN and for pre-operative clearance for left hip replacement.   Pt states he has been taking Avalide 150-12.5 mg. He is unsure if he is taking irbesartan with this. Long discussion that this is the same medication and we do not want to do this.  Hypertension  This is a chronic problem. The current episode started more than 1 year ago. The problem has been waxing and waning since onset. The problem is uncontrolled. Pertinent negatives include no headaches, peripheral edema or shortness of breath. The current treatment provides mild improvement.  Hip Pain   The incident occurred more than 1 week ago. The pain is present in the left hip. The quality of the pain is described as aching. The pain is at a severity of 10/10. The pain is moderate. The pain has been intermittent since onset. He reports no foreign bodies present. The symptoms are aggravated by movement and weight bearing. He has tried rest (ultram) for the symptoms. The treatment provided mild relief.      Review of Systems  Respiratory: Negative for shortness of breath.   Neurological: Negative for headaches.  All other systems reviewed and are negative.      Objective:   Physical Exam Vitals signs reviewed.  Constitutional:      General: He is not in acute distress.    Appearance: He is well-developed.  HENT:     Head: Normocephalic.  Eyes:     General:        Right eye: No discharge.        Left eye: No discharge.     Pupils: Pupils are equal, round, and reactive to light.  Neck:     Musculoskeletal: Normal range of motion and neck supple.     Thyroid: No thyromegaly.  Cardiovascular:     Rate and Rhythm: Normal rate and regular rhythm.     Heart sounds: Normal heart sounds. No murmur.    Pulmonary:     Effort: Pulmonary effort is normal. No respiratory distress.     Breath sounds: Normal breath sounds. No wheezing.  Abdominal:     General: Bowel sounds are normal. There is no distension.     Palpations: Abdomen is soft.     Tenderness: There is no abdominal tenderness.  Musculoskeletal:        General: No tenderness.     Comments: Pain in left hip with rotation and extension  Skin:    General: Skin is warm and dry.     Findings: No erythema or rash.  Neurological:     Mental Status: He is alert and oriented to person, place, and time.     Cranial Nerves: No cranial nerve deficit.     Deep Tendon Reflexes: Reflexes are normal and symmetric.  Psychiatric:        Behavior: Behavior normal.        Thought Content: Thought content normal.        Judgment: Judgment normal.     BP (!) 157/79   Pulse 75   Temp (!) 97.2 F (36.2 C) (Oral)   Ht '5\' 10"'  (1.778 m)   Wt 245 lb 12.8 oz (111.5 kg)   BMI 35.27 kg/m      Assessment &  Plan:  Jeffery Suarez comes in today with chief complaint of pre operative clearance, hypertension recheck   Diagnosis and orders addressed:  1. Pre-op evaluation - DG Chest 2 View; Future - EKG 12-Lead - CBC with Differential/Platelet - CMP14+EGFR  2. Essential hypertension - CBC with Differential/Platelet - CMP14+EGFR  3. Obesity (BMI 30-39.9) - CBC with Differential/Platelet - CMP14+EGFR  4. Primary osteoarthritis of left hip - CBC with Differential/Platelet - CMP14+EGFR  PT will call and let us know what medication he is currently taking as he is unsure if he is taking Avalide 150-12.5 mg or Irbesartan 300 mg and HCTZ 12.5 mg tablets.  -Dash diet information given -Exercise encouraged - Stress Management  -Continue current meds -RTO in 3 months   Evelina Dun, FNP

## 2018-05-23 NOTE — Telephone Encounter (Signed)
Pt called and updated his medication. Med list updated.

## 2018-05-24 LAB — CMP14+EGFR
ALT: 29 IU/L (ref 0–44)
AST: 23 IU/L (ref 0–40)
Albumin/Globulin Ratio: 1.8 (ref 1.2–2.2)
Albumin: 4.6 g/dL (ref 3.5–4.7)
Alkaline Phosphatase: 77 IU/L (ref 39–117)
BUN / CREAT RATIO: 21 (ref 10–24)
BUN: 24 mg/dL (ref 8–27)
Bilirubin Total: 0.7 mg/dL (ref 0.0–1.2)
CO2: 24 mmol/L (ref 20–29)
Calcium: 9.4 mg/dL (ref 8.6–10.2)
Chloride: 101 mmol/L (ref 96–106)
Creatinine, Ser: 1.16 mg/dL (ref 0.76–1.27)
GFR, EST AFRICAN AMERICAN: 68 mL/min/{1.73_m2} (ref >59)
GFR, EST NON AFRICAN AMERICAN: 59 mL/min/{1.73_m2} — AB (ref >59)
Globulin, Total: 2.6 g/dL (ref 1.5–4.5)
Glucose: 94 mg/dL (ref 65–99)
Potassium: 4.2 mmol/L (ref 3.5–5.2)
Sodium: 139 mmol/L (ref 134–144)
Total Protein: 7.2 g/dL (ref 6.0–8.5)

## 2018-05-24 LAB — CBC WITH DIFFERENTIAL/PLATELET
Basophils Absolute: 0.1 10*3/uL (ref 0.0–0.2)
Basos: 1 %
EOS (ABSOLUTE): 0.2 10*3/uL (ref 0.0–0.4)
Eos: 2 %
Hematocrit: 37.1 % — ABNORMAL LOW (ref 37.5–51.0)
Hemoglobin: 12.7 g/dL — ABNORMAL LOW (ref 13.0–17.7)
IMMATURE GRANS (ABS): 0 10*3/uL (ref 0.0–0.1)
Immature Granulocytes: 0 %
LYMPHS: 25 %
Lymphocytes Absolute: 1.9 10*3/uL (ref 0.7–3.1)
MCH: 29.7 pg (ref 26.6–33.0)
MCHC: 34.2 g/dL (ref 31.5–35.7)
MCV: 87 fL (ref 79–97)
Monocytes Absolute: 0.7 10*3/uL (ref 0.1–0.9)
Monocytes: 9 %
Neutrophils Absolute: 4.7 10*3/uL (ref 1.4–7.0)
Neutrophils: 63 %
Platelets: 274 10*3/uL (ref 150–450)
RBC: 4.28 x10E6/uL (ref 4.14–5.80)
RDW: 13.3 % (ref 11.6–15.4)
WBC: 7.5 10*3/uL (ref 3.4–10.8)

## 2018-05-26 ENCOUNTER — Other Ambulatory Visit: Payer: Self-pay | Admitting: Family

## 2018-06-01 DIAGNOSIS — M87859 Other osteonecrosis, unspecified femur: Secondary | ICD-10-CM | POA: Diagnosis not present

## 2018-07-06 NOTE — H&P (Signed)
TOTAL HIP ADMISSION H&P  Patient is admitted for left total hip arthroplasty.  Subjective:  Chief Complaint: left hip pain  HPI: Jeffery Suarez, 83 y.o. male, has a history of pain and functional disability in the left hip(s) due to arthritis and patient has failed non-surgical conservative treatments for greater than 12 weeks to include NSAID's and/or analgesics and activity modification.  Onset of symptoms was gradual starting 2 years ago with gradually worsening course since that time.The patient noted no past surgery on the left hip(s).  Patient currently rates pain in the left hip at 6 out of 10 with activity. Patient has worsening of pain with activity and weight bearing and instability. Patient has evidence of severe bone-on-bone arthritis with collapse of the femoral head by imaging studies. This condition presents safety issues increasing the risk of falls. There is no current active infection.  Patient Active Problem List   Diagnosis Date Noted  . Obesity (BMI 30-39.9) 03/18/2018  . Occlusion and stenosis of carotid artery without mention of cerebral infarction 07/22/2011  . Hypertension 12/11/2010  . Peripheral vascular disease (HCC) 12/11/2010  . TIA (transient ischemic attack) 12/11/2010  . Hyperlipidemia 12/11/2010   Past Medical History:  Diagnosis Date  . Carotid artery occlusion   . GERD (gastroesophageal reflux disease)   . Hx-TIA (transient ischemic attack)   . Hyperlipidemia   . Hypertension   . PVD (peripheral vascular disease) (HCC)    OCCLUDED LEFT INTERNAL CAROTID ARTERY    Past Surgical History:  Procedure Laterality Date  . APPENDECTOMY    . CARDIOVASCULAR STRESS TEST  06/07/2006   EF 63%  . US ECHOCARDIOGRAPHY  02/18/2006   EF 55-60%    No current facility-administered medications for this encounter.    Current Outpatient Medications  Medication Sig Dispense Refill Last Dose  . aspirin 81 MG tablet Take 81 mg by mouth daily.     Taking  . fish  oil-omega-3 fatty acids 1000 MG capsule Take 1 g by mouth 2 (two) times daily.     Taking  . hydrochlorothiazide (MICROZIDE) 12.5 MG capsule Take 12.5 mg by mouth daily.     . IBUPROFEN PO Take by mouth.   Taking  . irbesartan (AVAPRO) 300 MG tablet Take 300 mg by mouth daily.     . meloxicam (MOBIC) 15 MG tablet meloxicam 15 mg tablet  Take 1 tablet every day by oral route for 30 days.   Taking  . rosuvastatin (CRESTOR) 10 MG tablet Take 1 tablet (10 mg total) by mouth daily. 90 tablet 3 Taking   Allergies  Allergen Reactions  . Peanuts [Peanut Oil] Diarrhea  . Shrimp [Shellfish Allergy]     Social History   Tobacco Use  . Smoking status: Former Smoker    Last attempt to quit: 05/19/1983    Years since quitting: 35.1  . Smokeless tobacco: Never Used  Substance Use Topics  . Alcohol use: No    Family History  Problem Relation Age of Onset  . Heart attack Brother   . Cancer Sister   . Hypertension Mother      Review of Systems  Constitutional: Negative for chills and fever.  HENT: Negative for congestion, sore throat and tinnitus.   Eyes: Negative for double vision, photophobia and pain.  Respiratory: Negative for cough, shortness of breath and wheezing.   Cardiovascular: Negative for chest pain, palpitations and orthopnea.  Gastrointestinal: Negative for heartburn, nausea and vomiting.  Genitourinary: Negative for dysuria, frequency and urgency.  Musculoskeletal: Positive for joint pain.  Neurological: Negative for dizziness, weakness and headaches.    Objective:  Physical Exam  Well nourished and well developed.  General: Alert and oriented x3, cooperative and pleasant, no acute distress.  Head: normocephalic, atraumatic, neck supple.  Eyes: EOMI.  Respiratory: breath sounds clear in all fields, no wheezing, rales, or rhonchi. Cardiovascular: Regular rate and rhythm, no murmurs, gallops or rubs.  Abdomen: non-tender to palpation and soft, normoactive bowel  sounds. Musculoskeletal:  Left Hip Exam: ROM: Flexion to 90 degrees, Internal Rotation to 0 degrees, External Rotation to 10 degrees, and abduction to 20 degrees without discomfort. There is no tenderness over the greater trochanter bursa.   Calves soft and nontender. Motor function intact in LE. Strength 5/5 LE bilaterally. Neuro: Distal pulses 2+. Sensation to light touch intact in LE.  Vital signs in last 24 hours: Blood pressure: 156/86 mmHg Pulse: 92 bpm  Labs:   Estimated body mass index is 35.27 kg/m as calculated from the following:   Height as of 05/23/18: 5\' 10"  (1.778 m).   Weight as of 05/23/18: 111.5 kg.   Imaging Review Plain radiographs demonstrate severe degenerative joint disease of the left hip(s). The bone quality appears to be adequate for age and reported activity level.      Assessment/Plan:  End stage arthritis, left hip(s)  The patient history, physical examination, clinical judgement of the provider and imaging studies are consistent with end stage degenerative joint disease of the left hip(s) and total hip arthroplasty is deemed medically necessary. The treatment options including medical management, injection therapy, arthroscopy and arthroplasty were discussed at length. The risks and benefits of total hip arthroplasty were presented and reviewed. The risks due to aseptic loosening, infection, stiffness, dislocation/subluxation,  thromboembolic complications and other imponderables were discussed.  The patient acknowledged the explanation, agreed to proceed with the plan and consent was signed. Patient is being admitted for inpatient treatment for surgery, pain control, PT, OT, prophylactic antibiotics, VTE prophylaxis, progressive ambulation and ADL's and discharge planning.The patient is planning to be discharged home.  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 - Follow-up visit in 2 weeks with Dr. Lequita Halt - Begin physical therapy following surgery - Pre-operative lab work as pre-surgical testing - Prescriptions will be provided in hospital at time of discharge  Arther Abbott, PA-C Orthopedic Surgery [facility] [place]

## 2018-07-28 ENCOUNTER — Other Ambulatory Visit: Payer: Self-pay

## 2018-07-28 ENCOUNTER — Encounter (HOSPITAL_COMMUNITY)
Admission: RE | Admit: 2018-07-28 | Discharge: 2018-07-28 | Disposition: A | Discharge status: Home / Self Care | Payer: Medicare Other | Source: Ambulatory Visit | Attending: Orthopedic Surgery | Admitting: Orthopedic Surgery

## 2018-07-28 ENCOUNTER — Encounter (HOSPITAL_COMMUNITY): Payer: Self-pay

## 2018-07-28 DIAGNOSIS — Z01812 Encounter for preprocedural laboratory examination: Secondary | ICD-10-CM | POA: Insufficient documentation

## 2018-07-28 HISTORY — DX: Unspecified osteoarthritis, unspecified site: M19.90

## 2018-07-28 LAB — CBC
HCT: 42.7 % (ref 39.0–52.0)
Hemoglobin: 13.4 g/dL (ref 13.0–17.0)
MCH: 29.5 pg (ref 26.0–34.0)
MCHC: 31.4 g/dL (ref 30.0–36.0)
MCV: 94.1 fL (ref 80.0–100.0)
Platelets: 278 10*3/uL (ref 150–400)
RBC: 4.54 MIL/uL (ref 4.22–5.81)
RDW: 13.4 % (ref 11.5–15.5)
WBC: 7.5 10*3/uL (ref 4.0–10.5)
nRBC: 0 % (ref 0.0–0.2)

## 2018-07-28 LAB — ABO/RH: ABO/RH(D): AB POS

## 2018-07-28 LAB — COMPREHENSIVE METABOLIC PANEL
ALT: 35 U/L (ref 0–44)
ANION GAP: 9 (ref 5–15)
AST: 30 U/L (ref 15–41)
Albumin: 4.8 g/dL (ref 3.5–5.0)
Alkaline Phosphatase: 85 U/L (ref 38–126)
BUN: 32 mg/dL — ABNORMAL HIGH (ref 8–23)
CO2: 26 mmol/L (ref 22–32)
Calcium: 9.6 mg/dL (ref 8.9–10.3)
Chloride: 102 mmol/L (ref 98–111)
Creatinine, Ser: 1.27 mg/dL — ABNORMAL HIGH (ref 0.61–1.24)
GFR calc Af Amer: >60 mL/min (ref >60)
GFR calc non Af Amer: 53 mL/min — ABNORMAL LOW (ref >60)
Glucose, Bld: 110 mg/dL — ABNORMAL HIGH (ref 70–99)
Potassium: 4.3 mmol/L (ref 3.5–5.1)
SODIUM: 137 mmol/L (ref 135–145)
Total Bilirubin: 0.8 mg/dL (ref 0.3–1.2)
Total Protein: 8.1 g/dL (ref 6.5–8.1)

## 2018-07-28 LAB — SURGICAL PCR SCREEN
MRSA, PCR: NEGATIVE
STAPHYLOCOCCUS AUREUS: POSITIVE — AB

## 2018-07-28 LAB — APTT: APTT: 29 s (ref 24–36)

## 2018-07-28 LAB — PROTIME-INR
INR: 0.9 (ref 0.8–1.2)
Prothrombin Time: 12.4 seconds (ref 11.4–15.2)

## 2018-07-28 NOTE — Progress Notes (Signed)
   07/28/18 1105  OBSTRUCTIVE SLEEP APNEA  Have you ever been diagnosed with sleep apnea through a sleep study? No  Do you snore loudly (loud enough to be heard through closed doors)?  0  Do you often feel tired, fatigued, or sleepy during the daytime (such as falling asleep during driving or talking to someone)? 1  Has anyone observed you stop breathing during your sleep? 0  Do you have, or are you being treated for high blood pressure? 1  BMI more than 35 kg/m2? 1  Age > 50 (1-yes) 1  Neck circumference greater than:Male 16 inches or larger, Male 17inches or larger? 1  Male Gender (Yes=1) 1  Obstructive Sleep Apnea Score 6  Score 5 or greater  Results sent to PCP

## 2018-07-28 NOTE — Patient Instructions (Addendum)
Jeffery Suarez  07/28/2018   Your procedure is scheduled on: Wednesday 08/03/2018  [facility]              Report to admitting at   0745 AM    Call this number if you have problems the morning of surgery [phone]    Remember: Do not eat food or drink liquids :After Midnight.              BRUSH YOUR TEETH MORNING OF SURGERY AND RINSE YOUR MOUTH OUT, NO CHEWING GUM CANDY OR MINTS.     Take these medicines the morning of surgery with A SIP OF WATER: Rosuvastatin (Crestor if on day that you take it)                                 You may not have any metal on your body including hair pins and              piercings  Do not wear jewelry, make-up, lotions, powders or perfumes, deodorant                        Men may shave face and neck.   Do not bring valuables to the hospital. [facility] IS NOT             RESPONSIBLE   FOR VALUABLES.  Contacts, dentures or bridgework may not be worn into surgery.  Leave suitcase in the car. After surgery it may be brought to your room.                  Please read over the following fact sheets you were given: _____________________________________________________________________             [facility] - Preparing for Surgery Before surgery, you can play an important role.  Because skin is not sterile, your skin needs to be as free of germs as possible.  You can reduce the number of germs on your skin by washing with CHG (chlorahexidine gluconate) soap before surgery.  CHG is an antiseptic cleaner which kills germs and bonds with the skin to continue killing germs even after washing. Please DO NOT use if you have an allergy to CHG or antibacterial soaps.  If your skin becomes reddened/irritated stop using the CHG and inform your nurse when you arrive at Short Stay. Do not shave (including legs and underarms) for at least 48 hours prior to the first CHG shower.  You may shave your  face/neck. Please follow these instructions carefully:  1.  Shower with CHG Soap the night before surgery and the  morning of Surgery.  2.  If you choose to wash your hair, wash your hair first as usual with your  normal  shampoo.  3.  After you shampoo, rinse your hair and body thoroughly to remove the  shampoo.                           4.  Use CHG as you would any other liquid soap.  You can apply chg directly  to the skin and wash                       Gently with  a scrungie or clean washcloth.  5.  Apply the CHG Soap to your body ONLY FROM THE NECK DOWN.   Do not use on face/ open                           Wound or open sores. Avoid contact with eyes, ears mouth and genitals (private parts).                       Wash face,  Genitals (private parts) with your normal soap.             6.  Wash thoroughly, paying special attention to the area where your surgery  will be performed.  7.  Thoroughly rinse your body with warm water from the neck down.  8.  DO NOT shower/wash with your normal soap after using and rinsing off  the CHG Soap.                9.  Pat yourself dry with a clean towel.            10.  Wear clean pajamas.            11.  Place clean sheets on your bed the night of your first shower and do not  sleep with pets. Day of Surgery : Do not apply any lotions/deodorants the morning of surgery.  Please wear clean clothes to the hospital/surgery center.  FAILURE TO FOLLOW THESE INSTRUCTIONS MAY RESULT IN THE CANCELLATION OF YOUR SURGERY PATIENT SIGNATURE_________________________________  NURSE SIGNATURE__________________________________  ________________________________________________________________________   Rogelia Mire  An incentive spirometer is a tool that can help keep your lungs clear and active. This tool measures how well you are filling your lungs with each breath. Taking long deep breaths may help reverse or decrease the chance of developing breathing  (pulmonary) problems (especially infection) following:  A long period of time when you are unable to move or be active. BEFORE THE PROCEDURE   If the spirometer includes an indicator to show your best effort, your nurse or respiratory therapist will set it to a desired goal.  If possible, sit up straight or lean slightly forward. Try not to slouch.  Hold the incentive spirometer in an upright position. INSTRUCTIONS FOR USE  1. Sit on the edge of your bed if possible, or sit up as far as you can in bed or on a chair. 2. Hold the incentive spirometer in an upright position. 3. Breathe out normally. 4. Place the mouthpiece in your mouth and seal your lips tightly around it. 5. Breathe in slowly and as deeply as possible, raising the piston or the ball toward the top of the column. 6. Hold your breath for 3-5 seconds or for as long as possible. Allow the piston or ball to fall to the bottom of the column. 7. Remove the mouthpiece from your mouth and breathe out normally. 8. Rest for a few seconds and repeat Steps 1 through 7 at least 10 times every 1-2 hours when you are awake. Take your time and take a few normal breaths between deep breaths. 9. The spirometer may include an indicator to show your best effort. Use the indicator as a goal to work toward during each repetition. 10. After each set of 10 deep breaths, practice coughing to be sure your lungs are clear. If you have an incision (the cut made at the time of surgery), support your  incision when coughing by placing a pillow or rolled up towels firmly against it. Once you are able to get out of bed, walk around indoors and cough well. You may stop using the incentive spirometer when instructed by your caregiver.  RISKS AND COMPLICATIONS  Take your time so you do not get dizzy or light-headed.  If you are in pain, you may need to take or ask for pain medication before doing incentive spirometry. It is harder to take a deep breath if you  are having pain. AFTER USE  Rest and breathe slowly and easily.  It can be helpful to keep track of a log of your progress. Your caregiver can provide you with a simple table to help with this. If you are using the spirometer at home, follow these instructions: SEEK MEDICAL CARE IF:   You are having difficultly using the spirometer.  You have trouble using the spirometer as often as instructed.  Your pain medication is not giving enough relief while using the spirometer.  You develop fever of 100.5 F (38.1 C) or higher. SEEK IMMEDIATE MEDICAL CARE IF:   You cough up bloody sputum that had not been present before.  You develop fever of 102 F (38.9 C) or greater.  You develop worsening pain at or near the incision site. MAKE SURE YOU:   Understand these instructions.  Will watch your condition.  Will get help right away if you are not doing well or get worse. Document Released: 09/14/2006 Document Revised: 07/27/2011 Document Reviewed: 11/15/2006 Exit[employer] Patient Information 2014 [employer], [place].   ________________________________________________________________________  WHAT IS A BLOOD TRANSFUSION? Blood Transfusion Information  A transfusion is the replacement of blood or some of its parts. Blood is made up of multiple cells which provide different functions.  Red blood cells carry oxygen and are used for blood loss replacement.  White blood cells fight against infection.  Platelets control bleeding.  Plasma helps clot blood.  Other blood products are available for specialized needs, such as hemophilia or other clotting disorders. BEFORE THE TRANSFUSION  Who gives blood for transfusions?   Healthy volunteers who are fully evaluated to make sure their blood is safe. This is blood bank blood. Transfusion therapy is the safest it has ever been in the practice of medicine. Before blood is taken from a donor, a complete history is taken to make sure that person has  no history of diseases nor engages in risky social behavior (examples are intravenous drug use or sexual activity with multiple partners). The donor's travel history is screened to minimize risk of transmitting infections, such as malaria. The donated blood is tested for signs of infectious diseases, such as HIV and hepatitis. The blood is then tested to be sure it is compatible with you in order to minimize the chance of a transfusion reaction. If you or a relative donates blood, this is often done in anticipation of surgery and is not appropriate for emergency situations. It takes many days to process the donated blood. RISKS AND COMPLICATIONS Although transfusion therapy is very safe and saves many lives, the main dangers of transfusion include:   Getting an infectious disease.  Developing a transfusion reaction. This is an allergic reaction to something in the blood you were given. Every precaution is taken to prevent this. The decision to have a blood transfusion has been considered carefully by your caregiver before blood is given. Blood is not given unless the benefits outweigh the risks. AFTER THE TRANSFUSION  Right  after receiving a blood transfusion, you will usually feel much better and more energetic. This is especially true if your red blood cells have gotten low (anemic). The transfusion raises the level of the red blood cells which carry oxygen, and this usually causes an energy increase.  The nurse administering the transfusion will monitor you carefully for complications. HOME CARE INSTRUCTIONS  No special instructions are needed after a transfusion. You may find your energy is better. Speak with your caregiver about any limitations on activity for underlying diseases you may have. SEEK MEDICAL CARE IF:   Your condition is not improving after your transfusion.  You develop redness or irritation at the intravenous (IV) site. SEEK IMMEDIATE MEDICAL CARE IF:  Any of the following  symptoms occur over the next 12 hours:  Shaking chills.  You have a temperature by mouth above 102 F (38.9 C), not controlled by medicine.  Chest, back, or muscle pain.  People around you feel you are not acting correctly or are confused.  Shortness of breath or difficulty breathing.  Dizziness and fainting.  You get a rash or develop hives.  You have a decrease in urine output.  Your urine turns a dark color or changes to pink, red, or brown. Any of the following symptoms occur over the next 10 days:  You have a temperature by mouth above 102 F (38.9 C), not controlled by medicine.  Shortness of breath.  Weakness after normal activity.  The white part of the eye turns yellow (jaundice).  You have a decrease in the amount of urine or are urinating less often.  Your urine turns a dark color or changes to pink, red, or brown. Document Released: 05/01/2000 Document Revised: 07/27/2011 Document Reviewed: 12/19/2007 [facility] Patient Information 2014 [place], [place].  _______________________________________________________________________

## 2018-07-29 ENCOUNTER — Other Ambulatory Visit: Payer: Self-pay

## 2018-07-29 ENCOUNTER — Ambulatory Visit (INDEPENDENT_AMBULATORY_CARE_PROVIDER_SITE_OTHER): Payer: Medicare Other | Admitting: Family Medicine

## 2018-07-29 ENCOUNTER — Encounter: Payer: Self-pay | Admitting: Family Medicine

## 2018-07-29 VITALS — BP 132/80 | HR 104 | Temp 97.0°F | Ht 70.0 in | Wt 247.6 lb

## 2018-07-29 DIAGNOSIS — J309 Allergic rhinitis, unspecified: Secondary | ICD-10-CM | POA: Diagnosis not present

## 2018-07-29 MED ORDER — METHYLPREDNISOLONE ACETATE 80 MG/ML IJ SUSP
80.0000 mg | Freq: Once | INTRAMUSCULAR | Status: AC
  Administered 2018-07-29: 80 mg via INTRAMUSCULAR

## 2018-07-29 MED ORDER — FLUTICASONE PROPIONATE 50 MCG/ACT NA SUSP: 1.0000 | Freq: Two times a day (BID) | NASAL | 6 refills | Status: DC | PRN

## 2018-07-29 NOTE — Progress Notes (Signed)
BP 132/80   Pulse (!) 104   Temp (!) 97 F (36.1 C) (Oral)   Ht 5\' 10"  (1.778 m)   Wt 247 lb 9.6 oz (112.3 kg)   SpO2 95%   BMI 35.53 kg/m    Subjective:    Patient ID: Jeffery Suarez, male    DOB: 1935-07-17, 84 y.o.   MRN: [id number]  HPI: Jeffery Suarez is a 83 y.o. male presenting on 07/29/2018 for Cough (x 1 day) and Nasal Congestion   HPI Cough and nasal congestion and sinus congestion Patient comes in complaining of cough and nasal congestion sinus congestion is been going on for the past day.  He just is coming in today because he is concerned that is having a surgery next week and wants to get it checked out.  He denies any fevers or chills or shortness of breath or wheezing or sore throat but just has a little bit of sinus congestion and little bit of drainage and he wants to get it before it gets worse.  He says he does get allergies commonly this time a year and springtime is coming in the pollen is out currently.  Relevant past medical, surgical, family and social history reviewed and updated as indicated. Interim medical history since our last visit reviewed. Allergies and medications reviewed and updated.  Review of Systems  Constitutional: Negative for chills and fever.  HENT: Positive for congestion, postnasal drip, rhinorrhea, sinus pressure and sneezing. Negative for ear discharge, ear pain, sore throat and voice change.   Eyes: Negative for pain, discharge, redness and visual disturbance.  Respiratory: Positive for cough. Negative for shortness of breath and wheezing.   Cardiovascular: Negative for chest pain and leg swelling.  Musculoskeletal: Negative for gait problem.  Skin: Negative for rash.  All other systems reviewed and are negative.   Per HPI unless specifically indicated above      Objective:    BP 132/80   Pulse (!) 104   Temp (!) 97 F (36.1 C) (Oral)   Ht 5\' 10"  (1.778 m)   Wt 247 lb 9.6 oz (112.3 kg)   SpO2 95%   BMI 35.53 kg/m   Wt  Readings from Last 3 Encounters:  07/29/18 247 lb 9.6 oz (112.3 kg)  07/28/18 245 lb 11.2 oz (111.4 kg)  05/23/18 245 lb 12.8 oz (111.5 kg)    Physical Exam Vitals signs and nursing note reviewed.  Constitutional:      General: He is not in acute distress.    Appearance: He is well-developed. He is not diaphoretic.  HENT:     Right Ear: Tympanic membrane, ear canal and external ear normal.     Left Ear: Tympanic membrane, ear canal and external ear normal.     Nose: Mucosal edema and rhinorrhea present.     Right Sinus: No maxillary sinus tenderness or frontal sinus tenderness.     Left Sinus: No maxillary sinus tenderness or frontal sinus tenderness.     Mouth/Throat:     Pharynx: Uvula midline. No oropharyngeal exudate or posterior oropharyngeal erythema.     Tonsils: No tonsillar abscesses.  Eyes:     General: No scleral icterus.       Right eye: No discharge.     Conjunctiva/sclera: Conjunctivae normal.     Pupils: Pupils are equal, round, and reactive to light.  Neck:     Musculoskeletal: Neck supple.     Thyroid: No thyromegaly.  Cardiovascular:     Rate  and Rhythm: Normal rate and regular rhythm.     Heart sounds: Normal heart sounds. No murmur.  Pulmonary:     Effort: Pulmonary effort is normal. No respiratory distress.     Breath sounds: Examination of the right-upper field reveals rhonchi. Examination of the left-upper field reveals rhonchi. Rhonchi present. No wheezing or rales.  Musculoskeletal: Normal range of motion.  Lymphadenopathy:     Cervical: No cervical adenopathy.  Skin:    General: Skin is warm and dry.     Findings: No rash.  Neurological:     Mental Status: He is alert and oriented to person, place, and time.     Coordination: Coordination normal.  Psychiatric:        Behavior: Behavior normal.         Assessment & Plan:   Problem List Items Addressed This Visit    None    Visit Diagnoses    Allergic rhinitis, unspecified seasonality,  unspecified trigger    -  Primary   Relevant Medications   methylPREDNISolone acetate (DEPO-MEDROL) injection 80 mg (Completed) (Start on 07/29/2018 12:00 PM)   fluticasone (FLONASE) 50 MCG/ACT nasal spray       Follow up plan: Return if symptoms worsen or fail to improve.  Counseling provided for all of the vaccine components No orders of the defined types were placed in this encounter.   Arville Care, MD [facility] 07/29/2018, 11:49 AM

## 2018-08-01 NOTE — Anesthesia Preprocedure Evaluation (Addendum)
Anesthesia Evaluation  Patient identified by MRN, date of birth, ID band Patient awake    Reviewed: Allergy & Precautions, NPO status , Patient's Chart, lab work & pertinent test results  Airway Mallampati: II  TM Distance: >3 FB Neck ROM: Full    Dental no notable dental hx.    Pulmonary neg pulmonary ROS, former smoker,    Pulmonary exam normal breath sounds clear to auscultation       Cardiovascular hypertension, Pt. on medications negative cardio ROS Normal cardiovascular exam Rhythm:Regular Rate:Normal     Neuro/Psych TIAnegative psych ROS   GI/Hepatic Neg liver ROS, GERD  ,  Endo/Other  negative endocrine ROS  Renal/GU negative Renal ROS  negative genitourinary   Musculoskeletal  (+) Arthritis , Osteoarthritis,    Abdominal (+) + obese,   Peds negative pediatric ROS (+)  Hematology negative hematology ROS (+)   Anesthesia Other Findings   Reproductive/Obstetrics negative OB ROS                            Anesthesia Physical Anesthesia Plan  ASA: III  Anesthesia Plan: Spinal   Post-op Pain Management:    Induction: Intravenous  PONV Risk Score and Plan: 1 and Ondansetron  Airway Management Planned: Simple Face Mask  Additional Equipment:   Intra-op Plan:   Post-operative Plan:   Informed Consent: I have reviewed the patients History and Physical, chart, labs and discussed the procedure including the risks, benefits and alternatives for the proposed anesthesia with the patient or authorized representative who has indicated his/her understanding and acceptance.     Dental advisory given  Plan Discussed with: CRNA  Anesthesia Plan Comments: (See PAT note 07/28/18, Jodell Cipro, PA-C)       Anesthesia Quick Evaluation

## 2018-08-01 NOTE — Progress Notes (Signed)
Anesthesia Chart Review   Case:  [id number] Date/Time:  08/03/18 1000   Procedure:  TOTAL HIP ARTHROPLASTY ANTERIOR APPROACH (Left ) -   Anesthesia type:  Choice   Pre-op diagnosis:  left hip osteoarthritis   Location:  [facility] 09 / [facility]   Surgeon:  Ollen Gross, MD      DISCUSSION: 83 yo former smoker (quit 05/19/83) with h/o HTN, TIA, GERD, PVD, HLD, left hip OA scheduled for above procedure 08/03/18 with Dr. Ollen Gross.  Clearance received from PCP 05/26/18.  Per Jannifer Rodney, NP pt is moderate risk for surgical procedure due to age and HTN.    Pt can proceed with planned procedure barring acute status change.  VS: BP (!) 157/73   Pulse 94   Temp 36.8 C (Oral)   Resp 18   Ht 5\' 10"  (1.778 m)   Wt 111.4 kg   SpO2 99%   BMI 35.25 kg/m   PROVIDERS: Junie Spencer, FNP is PCP    LABS: Labs reviewed: Acceptable for surgery. (all labs ordered are listed, but only abnormal results are displayed)  Labs Reviewed  SURGICAL PCR SCREEN - Abnormal; Notable for the following components:      Result Value   Staphylococcus aureus POSITIVE (*)    All other components within normal limits  COMPREHENSIVE METABOLIC PANEL - Abnormal; Notable for the following components:   Glucose, Bld 110 (*)    BUN 32 (*)    Creatinine, Ser 1.27 (*)    GFR calc non Af Amer 53 (*)    All other components within normal limits  APTT  CBC  PROTIME-INR  TYPE AND SCREEN     IMAGES: Chest Xray 05/23/18 FINDINGS: The heart size and mediastinal contours are within normal limits. Both lungs are clear. The visualized skeletal structures are unremarkable.  IMPRESSION: No active cardiopulmonary disease.  Carotid Doppler 07/20/2012 Impression:  1. 1-39% right internal carotid artery stenosis 2. Right external carotid artery stenosis 3. Left internal carotid artery occlusion  Compared to the previous exam: There is no change when compared with the previous duplex exam performed on  07/23/2011  EKG: 05/23/2018 Rate 72 bpm Sinus rhythm   CV: Stress Test 06/07/2006 Overall Impression:  Normal stress nuclear study.  No evidence of inducible ischemia, normal LV systolic function EF 63%  Echo 02/18/2006 Impression:  Normal LV size and function, EF 55-60%, Mild LVH Normal RV size and function Mitral inflow and tissue doppler consistent with pseudonormalization and diastolic dysfunction Trileaflet aortic valve with mild aortic valve insufficiency Trivial mitral valve regurgitation Normal pulmonary artery pressure No pericardial effusion Past Medical History:  Diagnosis Date  . Arthritis   . Carotid artery occlusion   . GERD (gastroesophageal reflux disease)   . Hx-TIA (transient ischemic attack)   . Hyperlipidemia   . Hypertension   . PVD (peripheral vascular disease) (HCC)    OCCLUDED LEFT INTERNAL CAROTID ARTERY    Past Surgical History:  Procedure Laterality Date  . APPENDECTOMY    . CARDIOVASCULAR STRESS TEST  06/07/2006   EF 63%  . EYE SURGERY     bilateral cataract surgery with lens implants  . US ECHOCARDIOGRAPHY  02/18/2006   EF 55-60%    MEDICATIONS: . fish oil-omega-3 fatty acids 1000 MG capsule  . fluticasone (FLONASE) 50 MCG/ACT nasal spray  . hydrochlorothiazide (MICROZIDE) 12.5 MG capsule  . ibuprofen (ADVIL,MOTRIN) 200 MG tablet  . irbesartan (AVAPRO) 300 MG tablet  . rosuvastatin (CRESTOR) 10 MG  tablet   No current facility-administered medications for this encounter.     Janey Genta [facility] Pre-Surgical Testing [phone] 08/01/18 12:56 PM

## 2018-08-02 NOTE — Progress Notes (Addendum)
Called patient to inquire about symptoms of COVID 19 and any recent travel. Patient reports he went to his primary doctor last Friday for runny nose and was treated for allergies. He went to gun show last weekend in [place] with 100s of people. Called Dr Aluisio's office with above information.  Dr Aluisio's office called back and states as long as there is no fever, surgery will be done.

## 2018-08-03 ENCOUNTER — Other Ambulatory Visit: Payer: Self-pay

## 2018-08-03 ENCOUNTER — Inpatient Hospital Stay (HOSPITAL_COMMUNITY): Payer: Medicare Other

## 2018-08-03 ENCOUNTER — Inpatient Hospital Stay (HOSPITAL_COMMUNITY): Payer: Medicare Other | Admitting: Anesthesiology

## 2018-08-03 ENCOUNTER — Encounter (HOSPITAL_COMMUNITY)
Admission: RE | Disposition: A | Discharge status: Home Health | Payer: Self-pay | Source: Home / Self Care | Attending: Orthopedic Surgery

## 2018-08-03 ENCOUNTER — Inpatient Hospital Stay (HOSPITAL_COMMUNITY)
Admission: RE | Admit: 2018-08-03 | Discharge: 2018-08-04 | DRG: 470 | Disposition: A | Discharge status: Home Health | Payer: Medicare Other | Attending: Orthopedic Surgery | Admitting: Orthopedic Surgery

## 2018-08-03 ENCOUNTER — Inpatient Hospital Stay (HOSPITAL_COMMUNITY): Payer: Medicare Other | Admitting: Physician Assistant

## 2018-08-03 ENCOUNTER — Encounter (HOSPITAL_COMMUNITY): Payer: Self-pay | Admitting: *Deleted

## 2018-08-03 DIAGNOSIS — M1612 Unilateral primary osteoarthritis, left hip: Secondary | ICD-10-CM | POA: Diagnosis present

## 2018-08-03 DIAGNOSIS — M25752 Osteophyte, left hip: Secondary | ICD-10-CM | POA: Diagnosis present

## 2018-08-03 DIAGNOSIS — Z8249 Family history of ischemic heart disease and other diseases of the circulatory system: Secondary | ICD-10-CM

## 2018-08-03 DIAGNOSIS — I10 Essential (primary) hypertension: Secondary | ICD-10-CM | POA: Diagnosis present

## 2018-08-03 DIAGNOSIS — E669 Obesity, unspecified: Secondary | ICD-10-CM | POA: Diagnosis present

## 2018-08-03 DIAGNOSIS — I739 Peripheral vascular disease, unspecified: Secondary | ICD-10-CM | POA: Diagnosis present

## 2018-08-03 DIAGNOSIS — Z6835 Body mass index (BMI) 35.0-35.9, adult: Secondary | ICD-10-CM | POA: Diagnosis not present

## 2018-08-03 DIAGNOSIS — E785 Hyperlipidemia, unspecified: Secondary | ICD-10-CM | POA: Diagnosis present

## 2018-08-03 DIAGNOSIS — Z7982 Long term (current) use of aspirin: Secondary | ICD-10-CM

## 2018-08-03 DIAGNOSIS — Z791 Long term (current) use of non-steroidal anti-inflammatories (NSAID): Secondary | ICD-10-CM | POA: Diagnosis not present

## 2018-08-03 DIAGNOSIS — Z91013 Allergy to seafood: Secondary | ICD-10-CM | POA: Diagnosis not present

## 2018-08-03 DIAGNOSIS — Z87891 Personal history of nicotine dependence: Secondary | ICD-10-CM | POA: Diagnosis not present

## 2018-08-03 DIAGNOSIS — M25559 Pain in unspecified hip: Secondary | ICD-10-CM

## 2018-08-03 DIAGNOSIS — Z96642 Presence of left artificial hip joint: Secondary | ICD-10-CM | POA: Diagnosis not present

## 2018-08-03 DIAGNOSIS — R112 Nausea with vomiting, unspecified: Secondary | ICD-10-CM | POA: Diagnosis not present

## 2018-08-03 DIAGNOSIS — Z91010 Allergy to peanuts: Secondary | ICD-10-CM | POA: Diagnosis not present

## 2018-08-03 DIAGNOSIS — Z96649 Presence of unspecified artificial hip joint: Secondary | ICD-10-CM

## 2018-08-03 DIAGNOSIS — K219 Gastro-esophageal reflux disease without esophagitis: Secondary | ICD-10-CM | POA: Diagnosis present

## 2018-08-03 DIAGNOSIS — Z79899 Other long term (current) drug therapy: Secondary | ICD-10-CM

## 2018-08-03 DIAGNOSIS — M169 Osteoarthritis of hip, unspecified: Secondary | ICD-10-CM

## 2018-08-03 DIAGNOSIS — Z8673 Personal history of transient ischemic attack (TIA), and cerebral infarction without residual deficits: Secondary | ICD-10-CM | POA: Diagnosis not present

## 2018-08-03 DIAGNOSIS — M1990 Unspecified osteoarthritis, unspecified site: Secondary | ICD-10-CM | POA: Insufficient documentation

## 2018-08-03 DIAGNOSIS — I6522 Occlusion and stenosis of left carotid artery: Secondary | ICD-10-CM | POA: Diagnosis present

## 2018-08-03 HISTORY — PX: TOTAL HIP ARTHROPLASTY: SHX124

## 2018-08-03 LAB — TYPE AND SCREEN
ABO/RH(D): AB POS
ANTIBODY SCREEN: NEGATIVE

## 2018-08-03 SURGERY — ARTHROPLASTY, HIP, TOTAL, ANTERIOR APPROACH
Anesthesia: Spinal | Site: Hip | Laterality: Left

## 2018-08-03 MED ORDER — DIPHENHYDRAMINE HCL 12.5 MG/5ML PO ELIX: 12.5000 mg | ORAL_SOLUTION | ORAL | Status: DC | PRN

## 2018-08-03 MED ORDER — SODIUM CHLORIDE 0.9 % IV SOLN
INTRAVENOUS | Status: DC
  Administered 2018-08-03 – 2018-08-04 (×2): via INTRAVENOUS

## 2018-08-03 MED ORDER — BUPIVACAINE-EPINEPHRINE (PF) 0.25% -1:200000 IJ SOLN
INTRAMUSCULAR | Status: DC | PRN
  Administered 2018-08-03: 30 mL

## 2018-08-03 MED ORDER — METOCLOPRAMIDE HCL 5 MG PO TABS: 5.0000 mg | ORAL_TABLET | Freq: Three times a day (TID) | ORAL | Status: DC | PRN

## 2018-08-03 MED ORDER — ACETAMINOPHEN 10 MG/ML IV SOLN
1000.0000 mg | Freq: Four times a day (QID) | INTRAVENOUS | Status: DC
  Administered 2018-08-03: 1000 mg via INTRAVENOUS
  Filled 2018-08-03: qty 100

## 2018-08-03 MED ORDER — ONDANSETRON HCL 4 MG/2ML IJ SOLN
INTRAMUSCULAR | Status: DC | PRN
  Administered 2018-08-03: 4 mg via INTRAVENOUS

## 2018-08-03 MED ORDER — CEFAZOLIN SODIUM-DEXTROSE 2-4 GM/100ML-% IV SOLN
2.0000 g | INTRAVENOUS | Status: AC
  Administered 2018-08-03: 2 g via INTRAVENOUS
  Filled 2018-08-03: qty 100

## 2018-08-03 MED ORDER — DEXAMETHASONE SODIUM PHOSPHATE 10 MG/ML IJ SOLN
10.0000 mg | Freq: Once | INTRAMUSCULAR | Status: AC
  Administered 2018-08-04: 10 mg via INTRAVENOUS
  Filled 2018-08-03: qty 1

## 2018-08-03 MED ORDER — ROSUVASTATIN CALCIUM 10 MG PO TABS
10.0000 mg | ORAL_TABLET | ORAL | Status: DC
  Administered 2018-08-04: 10 mg via ORAL
  Filled 2018-08-03: qty 1

## 2018-08-03 MED ORDER — BUPIVACAINE IN DEXTROSE 0.75-8.25 % IT SOLN
INTRATHECAL | Status: DC | PRN
  Administered 2018-08-03: 1.6 mL via INTRATHECAL

## 2018-08-03 MED ORDER — 0.9 % SODIUM CHLORIDE (POUR BTL) OPTIME
TOPICAL | Status: DC | PRN
  Administered 2018-08-03: 1000 mL

## 2018-08-03 MED ORDER — POLYETHYLENE GLYCOL 3350 17 G PO PACK: 17.0000 g | PACK | Freq: Every day | ORAL | Status: DC | PRN

## 2018-08-03 MED ORDER — HYDROMORPHONE HCL 1 MG/ML IJ SOLN: 0.2500 mg | INTRAMUSCULAR | Status: DC | PRN

## 2018-08-03 MED ORDER — MORPHINE SULFATE (PF) 4 MG/ML IV SOLN
0.5000 mg | INTRAVENOUS | Status: DC | PRN
  Administered 2018-08-03 (×3): 1 mg via INTRAVENOUS
  Filled 2018-08-03 (×3): qty 1

## 2018-08-03 MED ORDER — PHENYLEPHRINE HCL 10 MG/ML IJ SOLN
INTRAMUSCULAR | Status: AC
  Filled 2018-08-03: qty 1

## 2018-08-03 MED ORDER — METHOCARBAMOL 500 MG IVPB - SIMPLE MED
INTRAVENOUS | Status: AC
  Administered 2018-08-03: 500 mg
  Filled 2018-08-03: qty 50

## 2018-08-03 MED ORDER — PROPOFOL 500 MG/50ML IV EMUL
INTRAVENOUS | Status: DC | PRN
  Administered 2018-08-03 (×2): 20 mg via INTRAVENOUS

## 2018-08-03 MED ORDER — TRANEXAMIC ACID-NACL 1000-0.7 MG/100ML-% IV SOLN
1000.0000 mg | INTRAVENOUS | Status: AC
  Administered 2018-08-03: 1000 mg via INTRAVENOUS
  Filled 2018-08-03: qty 100

## 2018-08-03 MED ORDER — RIVAROXABAN 10 MG PO TABS
10.0000 mg | ORAL_TABLET | Freq: Every day | ORAL | Status: DC
  Administered 2018-08-04: 10 mg via ORAL
  Filled 2018-08-03: qty 1

## 2018-08-03 MED ORDER — TRAMADOL HCL 50 MG PO TABS
50.0000 mg | ORAL_TABLET | Freq: Four times a day (QID) | ORAL | Status: DC | PRN
  Administered 2018-08-03: 100 mg via ORAL
  Filled 2018-08-03: qty 2

## 2018-08-03 MED ORDER — BISACODYL 10 MG RE SUPP: 10.0000 mg | Freq: Every day | RECTAL | Status: DC | PRN

## 2018-08-03 MED ORDER — METHOCARBAMOL 500 MG IVPB - SIMPLE MED
500.0000 mg | Freq: Four times a day (QID) | INTRAVENOUS | Status: DC | PRN
  Filled 2018-08-03: qty 50

## 2018-08-03 MED ORDER — METOCLOPRAMIDE HCL 5 MG/ML IJ SOLN
5.0000 mg | Freq: Three times a day (TID) | INTRAMUSCULAR | Status: DC | PRN
  Administered 2018-08-03: 10 mg via INTRAVENOUS
  Filled 2018-08-03: qty 2

## 2018-08-03 MED ORDER — CHLORHEXIDINE GLUCONATE 4 % EX LIQD: 60.0000 mL | Freq: Once | CUTANEOUS | Status: DC

## 2018-08-03 MED ORDER — FLUTICASONE PROPIONATE 50 MCG/ACT NA SUSP: 1.0000 | Freq: Two times a day (BID) | NASAL | Status: DC | PRN

## 2018-08-03 MED ORDER — METHOCARBAMOL 500 MG PO TABS
500.0000 mg | ORAL_TABLET | Freq: Four times a day (QID) | ORAL | Status: DC | PRN
  Administered 2018-08-03 – 2018-08-04 (×2): 500 mg via ORAL
  Filled 2018-08-03 (×2): qty 1

## 2018-08-03 MED ORDER — PHENOL 1.4 % MT LIQD: 1.0000 | OROMUCOSAL | Status: DC | PRN

## 2018-08-03 MED ORDER — IRBESARTAN 300 MG PO TABS
300.0000 mg | ORAL_TABLET | Freq: Every day | ORAL | Status: DC
  Administered 2018-08-04: 300 mg via ORAL
  Filled 2018-08-03: qty 2
  Filled 2018-08-03: qty 1

## 2018-08-03 MED ORDER — FLEET ENEMA 7-19 GM/118ML RE ENEM: 1.0000 | ENEMA | Freq: Once | RECTAL | Status: DC | PRN

## 2018-08-03 MED ORDER — OXYCODONE HCL 5 MG PO TABS: 5.0000 mg | ORAL_TABLET | Freq: Once | ORAL | Status: DC | PRN

## 2018-08-03 MED ORDER — STERILE WATER FOR IRRIGATION IR SOLN
Status: DC | PRN
  Administered 2018-08-03: 2000 mL

## 2018-08-03 MED ORDER — DEXAMETHASONE SODIUM PHOSPHATE 10 MG/ML IJ SOLN
8.0000 mg | Freq: Once | INTRAMUSCULAR | Status: AC
  Administered 2018-08-03: 8 mg via INTRAVENOUS

## 2018-08-03 MED ORDER — PROPOFOL 10 MG/ML IV BOLUS
INTRAVENOUS | Status: AC
  Filled 2018-08-03: qty 40

## 2018-08-03 MED ORDER — DOCUSATE SODIUM 100 MG PO CAPS
100.0000 mg | ORAL_CAPSULE | Freq: Two times a day (BID) | ORAL | Status: DC
  Administered 2018-08-03 – 2018-08-04 (×2): 100 mg via ORAL
  Filled 2018-08-03 (×2): qty 1

## 2018-08-03 MED ORDER — PROMETHAZINE HCL 25 MG/ML IJ SOLN
12.5000 mg | Freq: Four times a day (QID) | INTRAMUSCULAR | Status: DC | PRN
  Administered 2018-08-03: 12.5 mg via INTRAVENOUS
  Filled 2018-08-03 (×2): qty 1

## 2018-08-03 MED ORDER — HYDROCODONE-ACETAMINOPHEN 5-325 MG PO TABS
1.0000 | ORAL_TABLET | ORAL | Status: DC | PRN
  Administered 2018-08-03 – 2018-08-04 (×5): 2 via ORAL
  Filled 2018-08-03 (×5): qty 2

## 2018-08-03 MED ORDER — ONDANSETRON HCL 4 MG PO TABS: 4.0000 mg | ORAL_TABLET | Freq: Four times a day (QID) | ORAL | Status: DC | PRN

## 2018-08-03 MED ORDER — BUPIVACAINE-EPINEPHRINE (PF) 0.25% -1:200000 IJ SOLN
INTRAMUSCULAR | Status: AC
  Filled 2018-08-03: qty 30

## 2018-08-03 MED ORDER — CEFAZOLIN SODIUM-DEXTROSE 2-4 GM/100ML-% IV SOLN
2.0000 g | Freq: Four times a day (QID) | INTRAVENOUS | Status: AC
  Administered 2018-08-03 (×2): 2 g via INTRAVENOUS
  Filled 2018-08-03 (×2): qty 100

## 2018-08-03 MED ORDER — PROPOFOL 10 MG/ML IV BOLUS
INTRAVENOUS | Status: AC
  Filled 2018-08-03: qty 20

## 2018-08-03 MED ORDER — SODIUM CHLORIDE 0.9 % IV SOLN
INTRAVENOUS | Status: DC | PRN
  Administered 2018-08-03: 25 ug/min via INTRAVENOUS

## 2018-08-03 MED ORDER — MENTHOL 3 MG MT LOZG: 1.0000 | LOZENGE | OROMUCOSAL | Status: DC | PRN

## 2018-08-03 MED ORDER — LACTATED RINGERS IV SOLN
INTRAVENOUS | Status: DC
  Administered 2018-08-03 (×2): via INTRAVENOUS

## 2018-08-03 MED ORDER — ONDANSETRON HCL 4 MG/2ML IJ SOLN
4.0000 mg | Freq: Four times a day (QID) | INTRAMUSCULAR | Status: DC | PRN
  Administered 2018-08-03: 4 mg via INTRAVENOUS
  Filled 2018-08-03: qty 2

## 2018-08-03 MED ORDER — ACETAMINOPHEN 500 MG PO TABS: 500.0000 mg | ORAL_TABLET | Freq: Four times a day (QID) | ORAL | Status: DC

## 2018-08-03 MED ORDER — OXYCODONE HCL 5 MG/5ML PO SOLN: 5.0000 mg | Freq: Once | ORAL | Status: DC | PRN

## 2018-08-03 MED ORDER — HYDROCHLOROTHIAZIDE 12.5 MG PO CAPS
12.5000 mg | ORAL_CAPSULE | Freq: Every day | ORAL | Status: DC
  Administered 2018-08-04: 12.5 mg via ORAL
  Filled 2018-08-03: qty 1

## 2018-08-03 MED ORDER — PROPOFOL 500 MG/50ML IV EMUL
INTRAVENOUS | Status: DC | PRN
  Administered 2018-08-03: 50 ug/kg/min via INTRAVENOUS

## 2018-08-03 MED ORDER — PROMETHAZINE HCL 25 MG/ML IJ SOLN: 6.2500 mg | INTRAMUSCULAR | Status: DC | PRN

## 2018-08-03 SURGICAL SUPPLY — 44 items
BAG DECANTER FOR FLEXI CONT (MISCELLANEOUS) ×3 IMPLANT
BAG ZIPLOCK 12X15 (MISCELLANEOUS) IMPLANT
BLADE SAG 18X100X1.27 (BLADE) ×3 IMPLANT
BLADE SURG SZ10 CARB STEEL (BLADE) ×6 IMPLANT
CHLORAPREP W/TINT 26 (MISCELLANEOUS) ×3 IMPLANT
CLOSURE WOUND 1/2 X4 (GAUZE/BANDAGES/DRESSINGS) ×2
COVER PERINEAL POST (MISCELLANEOUS) ×3 IMPLANT
COVER SURGICAL LIGHT HANDLE (MISCELLANEOUS) ×3 IMPLANT
COVER WAND RF STERILE (DRAPES) IMPLANT
CUP ACETBLR 52 OD PINNACLE (Hips) ×3 IMPLANT
DECANTER SPIKE VIAL GLASS SM (MISCELLANEOUS) ×3 IMPLANT
DRAPE STERI IOBAN 125X83 (DRAPES) ×3 IMPLANT
DRAPE U-SHAPE 47X51 STRL (DRAPES) ×6 IMPLANT
DRSG ADAPTIC 3X8 NADH LF (GAUZE/BANDAGES/DRESSINGS) ×3 IMPLANT
DRSG MEPILEX BORDER 4X4 (GAUZE/BANDAGES/DRESSINGS) ×3 IMPLANT
DRSG MEPILEX BORDER 4X8 (GAUZE/BANDAGES/DRESSINGS) ×3 IMPLANT
Dual cut sagittal blade ×3 IMPLANT
ELECT BLADE TIP CTD 4 INCH (ELECTRODE) ×3 IMPLANT
ELECT REM PT RETURN 15FT ADLT (MISCELLANEOUS) ×3 IMPLANT
EVACUATOR 1/8 PVC DRAIN (DRAIN) ×3 IMPLANT
GLOVE BIO SURGEON STRL SZ7 (GLOVE) ×3 IMPLANT
GLOVE BIO SURGEON STRL SZ8 (GLOVE) ×3 IMPLANT
GLOVE BIOGEL PI IND STRL 7.0 (GLOVE) ×1 IMPLANT
GLOVE BIOGEL PI IND STRL 8 (GLOVE) ×1 IMPLANT
GLOVE BIOGEL PI INDICATOR 7.0 (GLOVE) ×2
GLOVE BIOGEL PI INDICATOR 8 (GLOVE) ×2
GOWN STRL REUS W/TWL LRG LVL3 (GOWN DISPOSABLE) ×6 IMPLANT
HEAD FEM STD 32X+5 STRL (Hips) ×3 IMPLANT
HOLDER FOLEY CATH W/STRAP (MISCELLANEOUS) ×3 IMPLANT
KIT TURNOVER KIT A (KITS) IMPLANT
LINER MARATHON NEUT +4X52X32 (Hips) ×3 IMPLANT
MANIFOLD NEPTUNE II (INSTRUMENTS) ×3 IMPLANT
PACK ANTERIOR HIP CUSTOM (KITS) ×3 IMPLANT
STEM FEMORAL SZ5 HIGH ACTIS (Nail) ×3 IMPLANT
STRIP CLOSURE SKIN 1/2X4 (GAUZE/BANDAGES/DRESSINGS) ×4 IMPLANT
SUT ETHIBOND NAB CT1 #1 30IN (SUTURE) ×3 IMPLANT
SUT MNCRL AB 4-0 PS2 18 (SUTURE) ×3 IMPLANT
SUT STRATAFIX 0 PDS 27 VIOLET (SUTURE) ×3
SUT VIC AB 2-0 CT1 27 (SUTURE) ×4
SUT VIC AB 2-0 CT1 TAPERPNT 27 (SUTURE) ×2 IMPLANT
SUTURE STRATFX 0 PDS 27 VIOLET (SUTURE) ×1 IMPLANT
SYR 50ML LL SCALE MARK (SYRINGE) IMPLANT
TRAY FOLEY MTR SLVR 16FR STAT (SET/KITS/TRAYS/PACK) ×3 IMPLANT
YANKAUER SUCT BULB TIP 10FT TU (MISCELLANEOUS) ×3 IMPLANT

## 2018-08-03 NOTE — Anesthesia Procedure Notes (Signed)
Spinal  Patient location during procedure: OR Staffing Anesthesiologist: Lynda Rainwater, MD Resident/CRNA: Glory Buff, CRNA Performed: resident/CRNA  Preanesthetic Checklist Completed: patient identified, site marked, surgical consent, pre-op evaluation, timeout performed, IV checked, risks and benefits discussed and monitors and equipment checked Spinal Block Patient position: sitting Prep: DuraPrep Patient monitoring: blood pressure, continuous pulse ox and heart rate Approach: midline Location: L2-3 Injection technique: single-shot Needle Needle type: Pencan  Needle gauge: 24 G Needle length: 9 cm Needle insertion depth: 5 cm Assessment Sensory level: T6 Additional Notes Kit expiration checked and verified.  Sterile prep and drape, skin local with 1% lidocaine, stick x 1, - heme, - paraesthesia, + CSF pre and post injection, patient tolerated well.

## 2018-08-03 NOTE — Discharge Instructions (Addendum)
Information on my medicine - XARELTO (Rivaroxaban)  This medication education was reviewed with me or my healthcare representative as part of my discharge preparation.   Why was Xarelto prescribed for you? Xarelto was prescribed for you to reduce the risk of blood clots forming after orthopedic surgery. The medical term for these abnormal blood clots is venous thromboembolism (VTE).  What do you need to know about xarelto ? Take your Xarelto ONCE DAILY at the same time every day. You may take it either with or without food.  If you have difficulty swallowing the tablet whole, you may crush it and mix in applesauce just prior to taking your dose.  Take Xarelto exactly as prescribed by your doctor and DO NOT stop taking Xarelto without talking to the doctor who prescribed the medication.  Stopping without other VTE prevention medication to take the place of Xarelto may increase your risk of developing a clot.  After discharge, you should have regular check-up appointments with your healthcare provider that is prescribing your Xarelto.    What do you do if you miss a dose? If you miss a dose, take it as soon as you remember on the same day then continue your regularly scheduled once daily regimen the next day. Do not take two doses of Xarelto on the same day.   Important Safety Information A possible side effect of Xarelto is bleeding. You should call your healthcare provider right away if you experience any of the following: ? Bleeding from an injury or your nose that does not stop. ? Unusual colored urine (red or dark brown) or unusual colored stools (red or black). ? Unusual bruising for unknown reasons. ? A serious fall or if you hit your head (even if there is no bleeding).  Some medicines may interact with Xarelto and might increase your risk of bleeding while on Xarelto. To help avoid this, consult your healthcare provider or pharmacist prior to using any new prescription  or non-prescription medications, including herbals, vitamins, non-steroidal anti-inflammatory drugs (NSAIDs) and supplements.  This website has more information on Xarelto: [email or web address].   Dr. Ollen GrossFrank Aluisio [facility] [address] [place], [place] [address] [phone]  ANTERIOR APPROACH TOTAL HIP REPLACEMENT POSTOPERATIVE DIRECTIONS   Hip Rehabilitation, Guidelines Following Surgery  The results of a hip operation are greatly improved after range of motion and muscle strengthening exercises. Follow all safety measures which are given to protect your hip. If any of these exercises cause increased pain or swelling in your joint, decrease the amount until you are comfortable again. Then slowly increase the exercises. Call your caregiver if you have problems or questions.   HOME CARE INSTRUCTIONS   Remove items at home which could result in a fall. This includes throw rugs or furniture in walking pathways.   ICE to the affected hip every three hours for 30 minutes at a time and then as needed for pain and swelling.  Continue to use ice on the hip for pain and swelling from surgery. You may notice swelling that will progress down to the foot and ankle.  This is normal after surgery.  Elevate the leg when you are not up walking on it.    Continue to use the breathing machine which will help keep your temperature down.  It is common for your temperature to cycle up and down following surgery, especially at night when you are not up moving around and exerting yourself.  The breathing machine  keeps your lungs expanded and your temperature down.  DIET You may resume your previous home diet once your are discharged from the hospital.  DRESSING / WOUND CARE / SHOWERING You may change your dressing 3-5 days after surgery.  Then change the dressing every day with sterile gauze.  Please use good hand washing techniques before changing the dressing.  Do not  use any lotions or creams on the incision until instructed by your surgeon. You may start showering once you are discharged home but do not submerge the incision under water. Just pat the incision dry and apply a dry gauze dressing on daily. Change the surgical dressing daily and reapply a dry dressing each time.  ACTIVITY Walk with your walker as instructed. Use walker as long as suggested by your caregivers. Avoid periods of inactivity such as sitting longer than an hour when not asleep. This helps prevent blood clots.  You may resume a sexual relationship in one month or when given the OK by your doctor.  You may return to work once you are cleared by your doctor.  Do not drive a car for 6 weeks or until released by you surgeon.  Do not drive while taking narcotics.  WEIGHT BEARING Weight bearing as tolerated with assist device (walker, cane, etc) as directed, use it as long as suggested by your surgeon or therapist, typically at least 4-6 weeks.  POSTOPERATIVE CONSTIPATION PROTOCOL Constipation - defined medically as fewer than three stools per week and severe constipation as less than one stool per week.  One of the most common issues patients have following surgery is constipation.  Even if you have a regular bowel pattern at home, your normal regimen is likely to be disrupted due to multiple reasons following surgery.  Combination of anesthesia, postoperative narcotics, change in appetite and fluid intake all can affect your bowels.  In order to avoid complications following surgery, here are some recommendations in order to help you during your recovery period.  Colace (docusate) - Pick up an over-the-counter form of Colace or another stool softener and take twice a day as long as you are requiring postoperative pain medications.  Take with a full glass of water daily.  If you experience loose stools or diarrhea, hold the colace until you stool forms back up.  If your symptoms do not get  better within 1 week or if they get worse, check with your doctor.  Dulcolax (bisacodyl) - Pick up over-the-counter and take as directed by the product packaging as needed to assist with the movement of your bowels.  Take with a full glass of water.  Use this product as needed if not relieved by Colace only.   MiraLax (polyethylene glycol) - Pick up over-the-counter to have on hand.  MiraLax is a solution that will increase the amount of water in your bowels to assist with bowel movements.  Take as directed and can mix with a glass of water, juice, soda, coffee, or tea.  Take if you go more than two days without a movement. Do not use MiraLax more than once per day. Call your doctor if you are still constipated or irregular after using this medication for 7 days in a row.  If you continue to have problems with postoperative constipation, please contact the office for further assistance and recommendations.  If you experience "the worst abdominal pain ever" or develop nausea or vomiting, please contact the office immediatly for further recommendations for treatment.  ITCHING  If  you experience itching with your medications, try taking only a single pain pill, or even half a pain pill at a time.  You can also use Benadryl over the counter for itching or also to help with sleep.   TED HOSE STOCKINGS Wear the elastic stockings on both legs for three weeks following surgery during the day but you may remove then at night for sleeping.  MEDICATIONS See your medication summary on the After Visit Summary that the nursing staff will review with you prior to discharge.  You may have some home medications which will be placed on hold until you complete the course of blood thinner medication.  It is important for you to complete the blood thinner medication as prescribed by your surgeon.  Continue your approved medications as instructed at time of discharge.  PRECAUTIONS If you experience chest pain or  shortness of breath - call 911 immediately for transfer to the hospital emergency department.  If you develop a fever greater that 101 F, purulent drainage from wound, increased redness or drainage from wound, foul odor from the wound/dressing, or calf pain - CONTACT YOUR SURGEON.                                                   FOLLOW-UP APPOINTMENTS Make sure you keep all of your appointments after your operation with your surgeon and caregivers. You should call the office at the above phone number and make an appointment for approximately two weeks after the date of your surgery or on the date instructed by your surgeon outlined in the "After Visit Summary".  RANGE OF MOTION AND STRENGTHENING EXERCISES  These exercises are designed to help you keep full movement of your hip joint. Follow your caregiver's or physical therapist's instructions. Perform all exercises about fifteen times, three times per day or as directed. Exercise both hips, even if you have had only one joint replacement. These exercises can be done on a training (exercise) mat, on the floor, on a table or on a bed. Use whatever works the best and is most comfortable for you. Use music or television while you are exercising so that the exercises are a pleasant break in your day. This will make your life better with the exercises acting as a break in routine you can look forward to.   Lying on your back, slowly slide your foot toward your buttocks, raising your knee up off the floor. Then slowly slide your foot back down until your leg is straight again.   Lying on your back spread your legs as far apart as you can without causing discomfort.   Lying on your side, raise your upper leg and foot straight up from the floor as far as is comfortable. Slowly lower the leg and repeat.   Lying on your back, tighten up the muscle in the front of your thigh (quadriceps muscles). You can do this by keeping your leg straight and trying to raise  your heel off the floor. This helps strengthen the largest muscle supporting your knee.   Lying on your back, tighten up the muscles of your buttocks both with the legs straight and with the knee bent at a comfortable angle while keeping your heel on the floor.   IF YOU ARE TRANSFERRED TO A SKILLED REHAB FACILITY If the patient is transferred  to a skilled rehab facility following release from the hospital, a list of the current medications will be sent to the facility for the patient to continue.  When discharged from the skilled rehab facility, please have the facility set up the patient's Home Health Physical Therapy prior to being released. Also, the skilled facility will be responsible for providing the patient with their medications at time of release from the facility to include their pain medication, the muscle relaxants, and their blood thinner medication. If the patient is still at the rehab facility at time of the two week follow up appointment, the skilled rehab facility will also need to assist the patient in arranging follow up appointment in our office and any transportation needs.  MAKE SURE YOU:   Understand these instructions.   Get help right away if you are not doing well or get worse.    Pick up stool softner and laxative for home use following surgery while on pain medications. Do not submerge incision under water. Please use good hand washing techniques while changing dressing each day. May shower starting three days after surgery. Please use a clean towel to pat the incision dry following showers. Continue to use ice for pain and swelling after surgery. Do not use any lotions or creams on the incision until instructed by your surgeon.

## 2018-08-03 NOTE — Progress Notes (Signed)
Pt having N/V unrelieved by IV Zofran & Reglan. Notified on-call provider for Dr. Lequita Halt. Received a call-back from [employer]. New orders for Phenergan 12.5mg  Q6 hrs PRN for N/V.

## 2018-08-03 NOTE — Anesthesia Procedure Notes (Signed)
Date/Time: 08/03/2018 9:44 AM Performed by: Thornell Mule, CRNA Oxygen Delivery Method: Simple face mask

## 2018-08-03 NOTE — Anesthesia Postprocedure Evaluation (Signed)
Anesthesia Post Note  Patient: Jeffery Suarez  Procedure(s) Performed: TOTAL HIP ARTHROPLASTY ANTERIOR APPROACH (Left Hip)     Patient location during evaluation: PACU Anesthesia Type: Spinal Level of consciousness: oriented and awake and alert Pain management: pain level controlled Vital Signs Assessment: post-procedure vital signs reviewed and stable Respiratory status: spontaneous breathing and respiratory function stable Cardiovascular status: blood pressure returned to baseline and stable Postop Assessment: no headache, no backache and no apparent nausea or vomiting Anesthetic complications: no    Last Vitals:  Vitals:   08/03/18 1230 08/03/18 1245  BP: (!) 124/52 (!) 144/63  Pulse: 77 77  Resp: 17 13  Temp:    SpO2: 100% 100%    Last Pain:  Vitals:   08/03/18 1245  TempSrc:   PainSc: 2                  Lowella Curb

## 2018-08-03 NOTE — Evaluation (Signed)
Physical Therapy Evaluation Patient Details Name: Jeffery Suarez MRN: [id number] DOB: 1935/07/14 Today's Date: 08/03/2018   History of Present Illness  83 yo male s/p L DA-THA on 08/03/18. PMH includes obesity, HTN, PVD with occluded internal carotid artery, TIA, GERD, HLD.  Clinical Impression   Pt presents with L hip pain, difficulty performing mobility tasks, increased time and effort to perform mobility, and dizziness/nausea/pallor with ambulation this session. Pt to benefit from acute PT to address deficits. Pt ambulated very short distance in room with min assist for steadying, limited by orthostatic hypotension and nausea. Pt educated on ankle pumps (20/hour) to perform this afternoon/evening to increase circulation, to pt's tolerance and limited by pain. PT to progress mobility as tolerated, and will continue to follow acutely.        Follow Up Recommendations Home health PT;Supervision for mobility/OOB    Equipment Recommendations  3in1 (PT)    Recommendations for Other Services       Precautions / Restrictions Precautions Precautions: Fall Restrictions Weight Bearing Restrictions: No Other Position/Activity Restrictions: WBAT       Mobility  Bed Mobility Overal bed mobility: Needs Assistance Bed Mobility: Supine to Sit     Supine to sit: Mod assist;+2 for physical assistance     General bed mobility comments: Mod assist +2 for LE lifting and translation to EOB, trunk elevation, and scooting to EOB with use of bed pad. Pt required increased time to come to sitting position, pushed up on RUE to prop to sitting.   Transfers Overall transfer level: Needs assistance Equipment used: Rolling walker (2 wheeled) Transfers: Sit to/from Stand Sit to Stand: Mod assist;+2 safety/equipment;From elevated surface         General transfer comment: Mod assist for power up, steadying. Increased time to rise. Verbal cuing to place at least one hand on bed when rising, pt used  both hands on RW anyway.   Ambulation/Gait Ambulation/Gait assistance: Min assist;+2 safety/equipment;+2 physical assistance Gait Distance (Feet): 3 Feet Assistive device: Rolling walker (2 wheeled) Gait Pattern/deviations: Step-to pattern;Decreased stride length;Decreased stance time - left;Decreased weight shift to left;Trunk flexed Gait velocity: decr    General Gait Details: Min assist +2 for steadying, very increased time to take steps. After 3 ft ambulation, pt reporting dizziness and nausea. Recliner brought up to pt, pt encouraged to sit. Pt with UE trembling due to fatigue when lowering self into recliner. Pt reclined immediately, BP and HR 129/80 and 83 bpm respectively (orthostatic for pt). Pt given cool cloth for forehead, decreased symptoms with rest.   Stairs            Wheelchair Mobility    Modified Rankin (Stroke Patients Only)       Balance Overall balance assessment: Mild deficits observed, not formally tested                                           Pertinent Vitals/Pain Pain Assessment: 0-10 Pain Score: 4  Pain Location: L hip  Pain Descriptors / Indicators: Sore;Tightness Pain Intervention(s): Limited activity within patient's tolerance;Premedicated before session;Monitored during session;Repositioned;Ice applied    Home Living Family/patient expects to be discharged to:: Private residence Living Arrangements: Spouse/significant other Available Help at Discharge: Family Type of Home: House Home Access: Stairs to enter Entrance Stairs-Rails: Doctor, general practice of Steps: 7 (back porch) Home Layout: Multi-level;Bed/bath upstairs Home Equipment: Environmental consultant - 2 wheels;Other (  comment) Additional Comments: walking stick     Prior Function Level of Independence: Independent               Hand Dominance   Dominant Hand: Right    Extremity/Trunk Assessment   Upper Extremity Assessment Upper Extremity  Assessment: Generalized weakness    Lower Extremity Assessment Lower Extremity Assessment: Generalized weakness;LLE deficits/detail LLE Deficits / Details: suspected post-surgical weakness; able to perform ankle pumps, very small heel slide limited by pain, quad set LLE Sensation: WNL    Cervical / Trunk Assessment Cervical / Trunk Assessment: Normal  Communication   Communication: No difficulties  Cognition Arousal/Alertness: Awake/alert Behavior During Therapy: WFL for tasks assessed/performed Overall Cognitive Status: Within Functional Limits for tasks assessed                                        General Comments General comments (skin integrity, edema, etc.): Of note, during PT session when pt LEs were raising in recliner, PT noticed hemovac lost suction and the collection bin filled with air. RN called to room, hemovac was out of place at surgery site, RN pulled hemovac, applied pressure, and taped over dressing.     Exercises     Assessment/Plan    PT Assessment Patient needs continued PT services  PT Problem List Decreased strength;Decreased safety awareness;Decreased mobility;Decreased range of motion;Decreased activity tolerance;Decreased balance;Decreased knowledge of use of DME;Pain       PT Treatment Interventions DME instruction;Functional mobility training;Balance training;Patient/family education;Gait training;Therapeutic activities;Neuromuscular re-education;Therapeutic exercise;Stair training    PT Goals (Current goals can be found in the Care Plan section)  Acute Rehab PT Goals Patient Stated Goal: return to PLOF PT Goal Formulation: With patient Time For Goal Achievement: 08/10/18 Potential to Achieve Goals: Good    Frequency 7X/week   Barriers to discharge        Co-evaluation               AM-PAC PT "6 Clicks" Mobility  Outcome Measure Help needed turning from your back to your side while in a flat bed without using  bedrails?: A Little Help needed moving from lying on your back to sitting on the side of a flat bed without using bedrails?: A Lot Help needed moving to and from a bed to a chair (including a wheelchair)?: A Little Help needed standing up from a chair using your arms (e.g., wheelchair or bedside chair)?: A Little Help needed to walk in hospital room?: A Lot Help needed climbing 3-5 steps with a railing? : A Lot 6 Click Score: 15    End of Session Equipment Utilized During Treatment: Gait belt Activity Tolerance: No increased pain;Treatment limited secondary to medical complications (Comment) Patient left: in chair;with chair alarm set;with call bell/phone within reach Nurse Communication: Mobility status PT Visit Diagnosis: Other abnormalities of gait and mobility (R26.89);Difficulty in walking, not elsewhere classified (R26.2)    Time: 1600-1630 PT Time Calculation (min) (ACUTE ONLY): 30 min   Charges:   PT Evaluation $PT Eval Low Complexity: 1 Low PT Treatments $Gait Training: 8-22 mins       Nicola Police, PT Acute Rehabilitation Services Pager [phone]  Office [phone]   Kanyla Omeara D Despina Hidden 08/03/2018, 6:09 PM

## 2018-08-03 NOTE — Op Note (Signed)
OPERATIVE REPORT- TOTAL HIP ARTHROPLASTY   PREOPERATIVE DIAGNOSIS: Osteoarthritis of the Left hip.   POSTOPERATIVE DIAGNOSIS: Osteoarthritis of the Left  hip.   PROCEDURE: Left total hip arthroplasty, anterior approach.   SURGEON: Ollen Gross, MD   ASSISTANT: Dennie Bible, PA-C  ANESTHESIA:  Spinal  ESTIMATED BLOOD LOSS:-300 mL    DRAINS: Hemovac x1.   COMPLICATIONS: None   CONDITION: PACU - hemodynamically stable.   BRIEF CLINICAL NOTE: Jeffery Suarez is a 83 y.o. male who has advanced end-  stage arthritis of their Left  hip with progressively worsening pain and  dysfunction.The patient has failed nonoperative management and presents for  total hip arthroplasty.   PROCEDURE IN DETAIL: After successful administration of spinal  anesthetic, the traction boots for the [facility] bed were placed on both  feet and the patient was placed onto the [facility] bed, boots placed into the leg  holders. The Left hip was then isolated from the perineum with plastic  drapes and prepped and draped in the usual sterile fashion. ASIS and  greater trochanter were marked and a oblique incision was made, starting  at about 1 cm lateral and 2 cm distal to the ASIS and coursing towards  the anterior cortex of the femur. The skin was cut with a 10 blade  through subcutaneous tissue to the level of the fascia overlying the  tensor fascia lata muscle. The fascia was then incised in line with the  incision at the junction of the anterior third and posterior 2/3rd. The  muscle was teased off the fascia and then the interval between the TFL  and the rectus was developed. The Hohmann retractor was then placed at  the top of the femoral neck over the capsule. The vessels overlying the  capsule were cauterized and the fat on top of the capsule was removed.  A Hohmann retractor was then placed anterior underneath the rectus  femoris to give exposure to the entire anterior capsule. A T-shaped   capsulotomy was performed. The edges were tagged and the femoral head  was identified.       Osteophytes are removed off the superior acetabulum.  The femoral neck was then cut in situ with an oscillating saw. Traction  was then applied to the left lower extremity utilizing the [facility]  traction. The femoral head was then removed. Retractors were placed  around the acetabulum and then circumferential removal of the labrum was  performed. Osteophytes were also removed. Reaming starts at 47 mm to  medialize and  Increased in 2 mm increments to 51 mm. We reamed in  approximately 40 degrees of abduction, 20 degrees anteversion. A 52 mm  pinnacle acetabular shell was then impacted in anatomic position under  fluoroscopic guidance with excellent purchase. We did not need to place  any additional dome screws. A 32 mm neutral + 4 marathon liner was then  placed into the acetabular shell.       The femoral lift was then placed along the lateral aspect of the femur  just distal to the vastus ridge. The leg was  externally rotated and capsule  was stripped off the inferior aspect of the femoral neck down to the  level of the lesser trochanter, this was done with electrocautery. The femur was lifted after this was performed. The  leg was then placed in an extended and adducted position essentially delivering the femur. We also removed the capsule superiorly and the piriformis from the piriformis fossa  to gain excellent exposure of the  proximal femur. Rongeur was used to remove some cancellous bone to get  into the lateral portion of the proximal femur for placement of the  initial starter reamer. The starter broaches was placed  the starter broach  and was shown to go down the center of the canal. Broaching  with the Actis system was then performed starting at size 0  coursing  Up to size 5. A size 5 had excellent torsional and rotational  and axial stability. The trial high offset neck was then placed   with a 32 + 5 trial head. The hip was then reduced. We confirmed that  the stem was in the canal both on AP and lateral x-rays. It also has excellent sizing. The hip was reduced with outstanding stability through full extension and full external rotation.. AP pelvis was taken and the leg lengths were measured and found to be equal. Hip was then dislocated again and the femoral head and neck removed. The  femoral broach was removed. Size 5 Actis stem with a high offset  neck was then impacted into the femur following native anteversion. Has  excellent purchase in the canal. Excellent torsional and rotational and  axial stability. It is confirmed to be in the canal on AP and lateral  fluoroscopic views. The 32 + 5 metal head was placed and the hip  reduced with outstanding stability. Again AP pelvis was taken and it  confirmed that the leg lengths were equal. The wound was then copiously  irrigated with saline solution and the capsule reattached and repaired  with Ethibond suture. 30 ml of .25% Bupivicaine was  injected into the capsule and into the edge of the tensor fascia lata as well as subcutaneous tissue. The fascia overlying the tensor fascia lata was then closed with a running #1 V-Loc. Subcu was closed with interrupted 2-0 Vicryl and subcuticular running 4-0 Monocryl. Incision was cleaned  and dried. Steri-Strips and a bulky sterile dressing applied. Hemovac  drain was hooked to suction and then the patient was awakened and transported to  recovery in stable condition.        Please note that a surgical assistant was a medical necessity for this procedure to perform it in a safe and expeditious manner. Assistant was necessary to provide appropriate retraction of vital neurovascular structures and to prevent femoral fracture and allow for anatomic placement of the prosthesis.  Ollen Gross, M.D.

## 2018-08-03 NOTE — Transfer of Care (Signed)
Immediate Anesthesia Transfer of Care Note  Patient: Jeffery Suarez  Procedure(s) Performed: TOTAL HIP ARTHROPLASTY ANTERIOR APPROACH (Left Hip)  Patient Location: PACU  Anesthesia Type:Spinal  Level of Consciousness: sedated  Airway & Oxygen Therapy: Patient Spontanous Breathing and Patient connected to face mask oxygen  Post-op Assessment: Report given to RN and Post -op Vital signs reviewed and stable  Post vital signs: Reviewed and stable  Last Vitals:  Vitals Value Taken Time  BP    Temp    Pulse 76 08/03/2018 11:37 AM  Resp 12 08/03/2018 11:37 AM  SpO2 98 % 08/03/2018 11:37 AM  Vitals shown include unvalidated device data.  Last Pain:  Vitals:   08/03/18 0743  TempSrc: Oral         Complications: No apparent anesthesia complications

## 2018-08-03 NOTE — Interval H&P Note (Signed)
History and Physical Interval Note:  08/03/2018 8:08 AM  Jeffery Suarez  has presented today for surgery, with the diagnosis of left hip osteoarthritis.  The various methods of treatment have been discussed with the patient and family. After consideration of risks, benefits and other options for treatment, the patient has consented to  Procedure(s) with comments: TOTAL HIP ARTHROPLASTY ANTERIOR APPROACH (Left) - as a surgical intervention.  The patient's history has been reviewed, patient examined, no change in status, stable for surgery.  I have reviewed the patient's chart and labs.  Questions were answered to the patient's satisfaction.     Homero Fellers Hao Dion

## 2018-08-04 LAB — CBC
HCT: 37.4 % — ABNORMAL LOW (ref 39.0–52.0)
Hemoglobin: 12.1 g/dL — ABNORMAL LOW (ref 13.0–17.0)
MCH: 30 pg (ref 26.0–34.0)
MCHC: 32.4 g/dL (ref 30.0–36.0)
MCV: 92.8 fL (ref 80.0–100.0)
Platelets: 215 10*3/uL (ref 150–400)
RBC: 4.03 MIL/uL — ABNORMAL LOW (ref 4.22–5.81)
RDW: 13.5 % (ref 11.5–15.5)
WBC: 12 10*3/uL — ABNORMAL HIGH (ref 4.0–10.5)
nRBC: 0 % (ref 0.0–0.2)

## 2018-08-04 LAB — BASIC METABOLIC PANEL
Anion gap: 9 (ref 5–15)
BUN: 30 mg/dL — ABNORMAL HIGH (ref 8–23)
CO2: 25 mmol/L (ref 22–32)
Calcium: 8.7 mg/dL — ABNORMAL LOW (ref 8.9–10.3)
Chloride: 101 mmol/L (ref 98–111)
Creatinine, Ser: 1 mg/dL (ref 0.61–1.24)
GFR calc Af Amer: >60 mL/min (ref >60)
GFR calc non Af Amer: >60 mL/min (ref >60)
Glucose, Bld: 113 mg/dL — ABNORMAL HIGH (ref 70–99)
Potassium: 4.1 mmol/L (ref 3.5–5.1)
Sodium: 135 mmol/L (ref 135–145)

## 2018-08-04 MED ORDER — METHOCARBAMOL 500 MG PO TABS: 500.0000 mg | ORAL_TABLET | Freq: Four times a day (QID) | ORAL | 0 refills | Status: DC | PRN

## 2018-08-04 MED ORDER — RIVAROXABAN 10 MG PO TABS: 10.0000 mg | ORAL_TABLET | Freq: Every day | ORAL | 0 refills | Status: DC

## 2018-08-04 MED ORDER — TRAMADOL HCL 50 MG PO TABS: 50.0000 mg | ORAL_TABLET | Freq: Four times a day (QID) | ORAL | 0 refills | Status: DC | PRN

## 2018-08-04 MED ORDER — HYDROCODONE-ACETAMINOPHEN 5-325 MG PO TABS: 1.0000 | ORAL_TABLET | Freq: Four times a day (QID) | ORAL | 0 refills | Status: DC | PRN

## 2018-08-04 NOTE — Progress Notes (Signed)
   Subjective: 1 Day Post-Op Procedure(s) (LRB): TOTAL HIP ARTHROPLASTY ANTERIOR APPROACH (Left) Patient reports pain as mild.   Patient seen in rounds by Dr. Lequita Halt. Patient is well, and has had no acute complaints or problems other than pain in the left hip. Had some issues with nausea/vomiting last night, relieved with phenergan. Denies chest pain or SOB. Foley catheter removed this AM. We will continue therapy today.   Objective: Vital signs in last 24 hours: Temp:  [97.4 F (36.3 C)-98.7 F (37.1 C)] 98.7 F (37.1 C) (03/19 0645) Pulse Rate:  [76-106] 79 (03/19 0645) Resp:  [12-17] 17 (03/19 0645) BP: (111-187)/(46-94) 130/70 (03/19 0645) SpO2:  [94 %-100 %] 99 % (03/19 0645) Weight:  [112.3 kg] 112.3 kg (03/18 1432)  Intake/Output from previous day:  Intake/Output Summary (Last 24 hours) at 08/04/2018 0704 Last data filed at 08/04/2018 0650 Gross per 24 hour  Intake 3974.37 ml  Output 3540 ml  Net 434.37 ml    Labs: Recent Labs    08/04/18 0445  HGB 12.1*   Recent Labs    08/04/18 0445  WBC 12.0*  RBC 4.03*  HCT 37.4*  PLT 215   Recent Labs    08/04/18 0445  NA 135  K 4.1  CL 101  CO2 25  BUN 30*  CREATININE 1.00  GLUCOSE 113*  CALCIUM 8.7*   Exam: General - Patient is Alert and Oriented Extremity - Neurologically intact Neurovascular intact Sensation intact distally Dorsiflexion/Plantar flexion intact Dressing - dressing C/D/I Motor Function - intact, moving foot and toes well on exam.   Past Medical History:  Diagnosis Date  . Arthritis   . Carotid artery occlusion   . GERD (gastroesophageal reflux disease)   . Hx-TIA (transient ischemic attack)   . Hyperlipidemia   . Hypertension   . PVD (peripheral vascular disease) (HCC)    OCCLUDED LEFT INTERNAL CAROTID ARTERY    Assessment/Plan: 1 Day Post-Op Procedure(s) (LRB): TOTAL HIP ARTHROPLASTY ANTERIOR APPROACH (Left) Principal Problem:   OA (osteoarthritis) of hip  Estimated body  mass index is 35.53 kg/m as calculated from the following:   Height as of this encounter: 5\' 10"  (1.778 m).   Weight as of this encounter: 112.3 kg. Advance diet Up with therapy D/C IV fluids  DVT Prophylaxis - Xarelto Weight bearing as tolerated. D/C O2 and pulse ox and try on room air. Hemovac pulled without difficulty, will continue therapy.  Plan is to go Home with HHPT after hospital stay. Possible discharge this afternoon if progresses with therapy and meeting his goals.  Arther Abbott, PA-C Orthopedic Surgery 08/04/2018, 7:04 AM

## 2018-08-04 NOTE — Discharge Summary (Signed)
Physician Discharge Summary   Patient ID: Jeffery Suarez MRN: [id number] DOB/AGE: 05-28-1935 83 y.o.  Admit date: 08/03/2018 Discharge date: 08/04/2018  Primary Diagnosis: Osteoarthritis, left hip   Admission Diagnoses:  Past Medical History:  Diagnosis Date  . Arthritis   . Carotid artery occlusion   . GERD (gastroesophageal reflux disease)   . Hx-TIA (transient ischemic attack)   . Hyperlipidemia   . Hypertension   . PVD (peripheral vascular disease) (HCC)    OCCLUDED LEFT INTERNAL CAROTID ARTERY   Discharge Diagnoses:   Principal Problem:   OA (osteoarthritis) of hip  Estimated body mass index is 35.53 kg/m as calculated from the following:   Height as of this encounter:  (1.778 m).   Weight as of this encounter: 112.3 kg.  Procedure:  Procedure(s) (LRB): TOTAL HIP ARTHROPLASTY ANTERIOR APPROACH (Left)   Consults: None  HPI: Jeffery Suarez is a 83 y.o. male who has advanced end-stage arthritis of their Left  hip with progressively worsening pain and dysfunction.The patient has failed nonoperative management and presents for total hip arthroplasty.   Laboratory Data: Admission on 08/03/2018, Discharged on 08/04/2018  Component Date Value Ref Range Status  . ABO/RH(D) 07/28/2018    Final                   Value:AB POS Performed at [facility], [address], [place], [place] [address]   . WBC 08/04/2018 12.0* 4.0 - 10.5 K/uL Final  . RBC 08/04/2018 4.03* 4.22 - 5.81 MIL/uL Final  . Hemoglobin 08/04/2018 12.1* 13.0 - 17.0 g/dL Final  . HCT 19/14/7829 37.4* 39.0 - 52.0 % Final  . MCV 08/04/2018 92.8  80.0 - 100.0 fL Final  . MCH 08/04/2018 30.0  26.0 - 34.0 pg Final  . MCHC 08/04/2018 32.4  30.0 - 36.0 g/dL Final  . RDW 56/21/3086 13.5  11.5 - 15.5 % Final  . Platelets 08/04/2018 215  150 - 400 K/uL Final  . nRBC 08/04/2018 0.0  0.0 - 0.2 % Final   Performed at [facility], [address], [place], [place] [address]   . Sodium 08/04/2018 135  135 - 145 mmol/L Final  . Potassium 08/04/2018 4.1  3.5 - 5.1 mmol/L Final  . Chloride 08/04/2018 101  98 - 111 mmol/L Final  . CO2 08/04/2018 25  22 - 32 mmol/L Final  . Glucose, Bld 08/04/2018 113* 70 - 99 mg/dL Final  . BUN 96/29/5284 30* 8 - 23 mg/dL Final  . Creatinine, Ser 08/04/2018 1.00  0.61 - 1.24 mg/dL Final  . Calcium 13/24/4010 8.7* 8.9 - 10.3 mg/dL Final  . GFR calc non Af Amer 08/04/2018 >60  >60 mL/min Final  . GFR calc Af Amer 08/04/2018 >60  >60 mL/min Final  . Anion gap 08/04/2018 9  5 - 15 Final   Performed at [facility], [address], [place], [place] [address]  Hospital Outpatient Visit on 07/28/2018  Component Date Value Ref Range Status  . MRSA, PCR 07/28/2018 NEGATIVE  NEGATIVE Final  . Staphylococcus aureus 07/28/2018 POSITIVE* NEGATIVE Final   Comment: (NOTE) The Xpert SA Assay (FDA approved for NASAL specimens in patients 67 years of age and older), is one component of a comprehensive surveillance program. It is not intended to diagnose infection nor to guide or monitor treatment. Performed at [facility], [address], [place], [place] [address]   . aPTT 07/28/2018 29  24 - 36 seconds Final  Performed at [facility], [address], [place], [place] [address]  . WBC 07/28/2018 7.5  4.0 - 10.5 K/uL Final  . RBC 07/28/2018 4.54  4.22 - 5.81 MIL/uL Final  . Hemoglobin 07/28/2018 13.4  13.0 - 17.0 g/dL Final  . HCT 60/45/4098 42.7  39.0 - 52.0 % Final  . MCV 07/28/2018 94.1  80.0 - 100.0 fL Final  . MCH 07/28/2018 29.5  26.0 - 34.0 pg Final  . MCHC 07/28/2018 31.4  30.0 - 36.0 g/dL Final  . RDW 11/91/4782 13.4  11.5 - 15.5 % Final  . Platelets 07/28/2018 278  150 - 400 K/uL Final  . nRBC 07/28/2018 0.0  0.0 - 0.2 % Final   Performed at [facility] [address], [place], [place] [address]  . Sodium 07/28/2018 137  135 - 145 mmol/L Final  .  Potassium 07/28/2018 4.3  3.5 - 5.1 mmol/L Final  . Chloride 07/28/2018 102  98 - 111 mmol/L Final  . CO2 07/28/2018 26  22 - 32 mmol/L Final  . Glucose, Bld 07/28/2018 110* 70 - 99 mg/dL Final  . BUN 30/86/5784 32* 8 - 23 mg/dL Final  . Creatinine, Ser 07/28/2018 1.27* 0.61 - 1.24 mg/dL Final  . Calcium 69/62/9528 9.6  8.9 - 10.3 mg/dL Final  . Total Protein 07/28/2018 8.1  6.5 - 8.1 g/dL Final  . Albumin 41/32/4401 4.8  3.5 - 5.0 g/dL Final  . AST 02/72/5366 30  15 - 41 U/L Final  . ALT 07/28/2018 35  0 - 44 U/L Final  . Alkaline Phosphatase 07/28/2018 85  38 - 126 U/L Final  . Total Bilirubin 07/28/2018 0.8  0.3 - 1.2 mg/dL Final  . GFR calc non Af Amer 07/28/2018 53* >60 mL/min Final  . GFR calc Af Amer 07/28/2018 >60  >60 mL/min Final  . Anion gap 07/28/2018 9  5 - 15 Final   Performed at [facility], [address], [place], [place] [address]  . Prothrombin Time 07/28/2018 12.4  11.4 - 15.2 seconds Final  . INR 07/28/2018 0.9  0.8 - 1.2 Final   Comment: (NOTE) INR goal varies based on device and disease states. Performed at [facility], [address] [place], [place] [address]   . ABO/RH(D) 07/28/2018 AB POS   Final  . Antibody Screen 07/28/2018 NEG   Final  . Sample Expiration 07/28/2018 08/06/2018   Final  . Extend sample reason 07/28/2018    Final                   Value:NO TRANSFUSIONS OR PREGNANCY IN THE PAST 3 MONTHS Performed at [facility], [address], [place], [place] [address]      X-Rays:Dg Pelvis Portable  Result Date: 08/03/2018 CLINICAL DATA:  Status post left hip replacement EXAM: PORTABLE PELVIS 1-2 VIEWS COMPARISON:  None. FINDINGS: Left hip prosthesis is noted in satisfactory position. Surgical drain is noted in place. No acute bony or soft tissue abnormality is seen. IMPRESSION: No acute abnormality following left hip prosthesis Electronically Signed   By: Alcide Clever M.D.   On: 08/03/2018  12:01   Dg C-arm 1-60 Min  Result Date: 08/03/2018 CLINICAL DATA:  Left hip replacement EXAM: DG C-ARM 61-120 MIN; OPERATIVE LEFT HIP WITH PELVIS COMPARISON:  None. FLUOROSCOPY TIME:  Fluoroscopy Time:  11 seconds Radiation Exposure Index (if provided by the fluoroscopic device): 0.93 mGy Number of Acquired Spot Images: 2  FINDINGS: Left hip replacement is noted in satisfactory position. No acute bony or soft tissue abnormality is noted. IMPRESSION: Status post left hip replacement Electronically Signed   By: Alcide Clever M.D.   On: 08/03/2018 11:25   Dg Hip Operative Unilat W Or W/o Pelvis Left  Result Date: 08/03/2018 CLINICAL DATA:  Left hip replacement EXAM: DG C-ARM 61-120 MIN; OPERATIVE LEFT HIP WITH PELVIS COMPARISON:  None. FLUOROSCOPY TIME:  Fluoroscopy Time:  11 seconds Radiation Exposure Index (if provided by the fluoroscopic device): 0.93 mGy Number of Acquired Spot Images: 2 FINDINGS: Left hip replacement is noted in satisfactory position. No acute bony or soft tissue abnormality is noted. IMPRESSION: Status post left hip replacement Electronically Signed   By: Alcide Clever M.D.   On: 08/03/2018 11:25    EKG: Orders placed or performed in visit on 05/23/18  . EKG 12-Lead     Hospital Course: Jeffery Suarez is a 83 y.o. who was admitted to [facility]. They were brought to the operating room on 08/03/2018 and underwent Procedure(s): TOTAL HIP ARTHROPLASTY ANTERIOR APPROACH.  Patient tolerated the procedure well and was later transferred to the recovery room and then to the orthopaedic floor for postoperative care. They were given PO and IV analgesics for pain control following their surgery. They were given 24 hours of postoperative antibiotics of  Anti-infectives (From admission, onward)   Start     Dose/Rate Suarez Frequency Ordered Stop   08/03/18 1600  ceFAZolin (ANCEF) IVPB 2g/100 mL premix     2 g 200 mL/hr over 30 Minutes Intravenous Every 6 hours 08/03/18 1342 08/03/18  2327   08/03/18 0730  ceFAZolin (ANCEF) IVPB 2g/100 mL premix     2 g 200 mL/hr over 30 Minutes Intravenous On call to O.R. 08/03/18 9450 08/03/18 1023     and started on DVT prophylaxis in the form of Xarelto.   PT and OT were ordered for total joint protocol. Discharge planning consulted to help with postop disposition and equipment needs.  Patient had a decent night on the evening of surgery. Had issues with nausea/vomiting, which was relieved with phenergan. They started to get up OOB with therapy on POD #0. Pt was seen during rounds and was ready to go home pending progress with therapy. Hemovac drain was pulled without difficulty. He worked with therapy on POD #1 and was meeting his goals. Pt was discharged to home later that day in stable condition.  Diet: Cardiac diet Activity: WBAT Follow-up: in 2 weeks Disposition: Home with HHPT Discharged Condition: stable   Discharge Instructions    Call MD / Call 911   Complete by:  As directed    If you experience chest pain or shortness of breath, CALL 911 and be transported to the hospital emergency room.  If you develope a fever above 101 F, pus (white drainage) or increased drainage or redness at the wound, or calf pain, call your surgeon's office.   Change dressing   Complete by:  As directed    You may change your dressing on Friday, then change the dressing daily with sterile 4 x 4 inch gauze dressing and paper tape.   Constipation Prevention   Complete by:  As directed    Drink plenty of fluids.  Prune juice may be helpful.  You may use a stool softener, such as Colace (over the counter) 100 mg twice a day.  Use MiraLax (over the counter) for constipation as needed.   Diet -  low sodium heart healthy   Complete by:  As directed    Discharge instructions   Complete by:  As directed    Dr. Ollen GrossFrank Aluisio Total Joint Specialist [facility] [address] [place], [place] [address] [phone]  ANTERIOR APPROACH TOTAL  HIP REPLACEMENT POSTOPERATIVE DIRECTIONS   Hip Rehabilitation, Guidelines Following Surgery  The results of a hip operation are greatly improved after range of motion and muscle strengthening exercises. Follow all safety measures which are given to protect your hip. If any of these exercises cause increased pain or swelling in your joint, decrease the amount until you are comfortable again. Then slowly increase the exercises. Call your caregiver if you have problems or questions.   HOME CARE INSTRUCTIONS  Remove items at home which could result in a fall. This includes throw rugs or furniture in walking pathways.  ICE to the affected hip every three hours for 30 minutes at a time and then as needed for pain and swelling.  Continue to use ice on the hip for pain and swelling from surgery. You may notice swelling that will progress down to the foot and ankle.  This is normal after surgery.  Elevate the leg when you are not up walking on it.   Continue to use the breathing machine which will help keep your temperature down.  It is common for your temperature to cycle up and down following surgery, especially at night when you are not up moving around and exerting yourself.  The breathing machine keeps your lungs expanded and your temperature down.  DIET You may resume your previous home diet once your are discharged from the hospital.  DRESSING / WOUND CARE / SHOWERING You may change your dressing 3-5 days after surgery.  Then change the dressing every day with sterile gauze.  Please use good hand washing techniques before changing the dressing.  Do not use any lotions or creams on the incision until instructed by your surgeon. You may start showering once you are discharged home but do not submerge the incision under water. Just pat the incision dry and apply a dry gauze dressing on daily. Change the surgical dressing daily and reapply a dry dressing each time.  ACTIVITY Walk with your walker as  instructed. Use walker as long as suggested by your caregivers. Avoid periods of inactivity such as sitting longer than an hour when not asleep. This helps prevent blood clots.  You may resume a sexual relationship in one month or when given the OK by your doctor.  You may return to work once you are cleared by your doctor.  Do not drive a car for 6 weeks or until released by you surgeon.  Do not drive while taking narcotics.  WEIGHT BEARING Weight bearing as tolerated with assist device (walker, cane, etc) as directed, use it as long as suggested by your surgeon or therapist, typically at least 4-6 weeks.  POSTOPERATIVE CONSTIPATION PROTOCOL Constipation - defined medically as fewer than three stools per week and severe constipation as less than one stool per week.  One of the most common issues patients have following surgery is constipation.  Even if you have a regular bowel pattern at home, your normal regimen is likely to be disrupted due to multiple reasons following surgery.  Combination of anesthesia, postoperative narcotics, change in appetite and fluid intake all can affect your bowels.  In order to avoid complications following surgery, here are some recommendations in order to help you  during your recovery period.  Colace (docusate) - Pick up an over-the-counter form of Colace or another stool softener and take twice a day as long as you are requiring postoperative pain medications.  Take with a full glass of water daily.  If you experience loose stools or diarrhea, hold the colace until you stool forms back up.  If your symptoms do not get better within 1 week or if they get worse, check with your doctor.  Dulcolax (bisacodyl) - Pick up over-the-counter and take as directed by the product packaging as needed to assist with the movement of your bowels.  Take with a full glass of water.  Use this product as needed if not relieved by Colace only.   MiraLax (polyethylene glycol) - Pick  up over-the-counter to have on hand.  MiraLax is a solution that will increase the amount of water in your bowels to assist with bowel movements.  Take as directed and can mix with a glass of water, juice, soda, coffee, or tea.  Take if you go more than two days without a movement. Do not use MiraLax more than once per day. Call your doctor if you are still constipated or irregular after using this medication for 7 days in a row.  If you continue to have problems with postoperative constipation, please contact the office for further assistance and recommendations.  If you experience "the worst abdominal pain ever" or develop nausea or vomiting, please contact the office immediatly for further recommendations for treatment.  ITCHING  If you experience itching with your medications, try taking only a single pain pill, or even half a pain pill at a time.  You can also use Benadryl over the counter for itching or also to help with sleep.   TED HOSE STOCKINGS Wear the elastic stockings on both legs for three weeks following surgery during the day but you may remove then at night for sleeping.  MEDICATIONS See your medication summary on the "After Visit Summary" that the nursing staff will review with you prior to discharge.  You may have some home medications which will be placed on hold until you complete the course of blood thinner medication.  It is important for you to complete the blood thinner medication as prescribed by your surgeon.  Continue your approved medications as instructed at time of discharge.  PRECAUTIONS If you experience chest pain or shortness of breath - call 911 immediately for transfer to the hospital emergency department.  If you develop a fever greater that 101 F, purulent drainage from wound, increased redness or drainage from wound, foul odor from the wound/dressing, or calf pain - CONTACT YOUR SURGEON.                                                   FOLLOW-UP APPOINTMENTS  Make sure you keep all of your appointments after your operation with your surgeon and caregivers. You should call the office at the above phone number and make an appointment for approximately two weeks after the date of your surgery or on the date instructed by your surgeon outlined in the "After Visit Summary".  RANGE OF MOTION AND STRENGTHENING EXERCISES  These exercises are designed to help you keep full movement of your hip joint. Follow your caregiver's or physical therapist's instructions. Perform all exercises about fifteen times, three  times per day or as directed. Exercise both hips, even if you have had only one joint replacement. These exercises can be done on a training (exercise) mat, on the floor, on a table or on a bed. Use whatever works the best and is most comfortable for you. Use music or television while you are exercising so that the exercises are a pleasant break in your day. This will make your life better with the exercises acting as a break in routine you can look forward to.  Lying on your back, slowly slide your foot toward your buttocks, raising your knee up off the floor. Then slowly slide your foot back down until your leg is straight again.  Lying on your back spread your legs as far apart as you can without causing discomfort.  Lying on your side, raise your upper leg and foot straight up from the floor as far as is comfortable. Slowly lower the leg and repeat.  Lying on your back, tighten up the muscle in the front of your thigh (quadriceps muscles). You can do this by keeping your leg straight and trying to raise your heel off the floor. This helps strengthen the largest muscle supporting your knee.  Lying on your back, tighten up the muscles of your buttocks both with the legs straight and with the knee bent at a comfortable angle while keeping your heel on the floor.   IF YOU ARE TRANSFERRED TO A SKILLED REHAB FACILITY If the patient is transferred to a skilled rehab  facility following release from the hospital, a list of the current medications will be sent to the facility for the patient to continue.  When discharged from the skilled rehab facility, please have the facility set up the patient's Home Health Physical Therapy prior to being released. Also, the skilled facility will be responsible for providing the patient with their medications at time of release from the facility to include their pain medication, the muscle relaxants, and their blood thinner medication. If the patient is still at the rehab facility at time of the two week follow up appointment, the skilled rehab facility will also need to assist the patient in arranging follow up appointment in our office and any transportation needs.  MAKE SURE YOU:  Understand these instructions.  Get help right away if you are not doing well or get worse.    Pick up stool softner and laxative for home use following surgery while on pain medications. Do not submerge incision under water. Please use good hand washing techniques while changing dressing each day. May shower starting three days after surgery. Please use a clean towel to pat the incision dry following showers. Continue to use ice for pain and swelling after surgery. Do not use any lotions or creams on the incision until instructed by your surgeon.   Do not sit on low chairs, stoools or toilet seats, as it may be difficult to get up from low surfaces   Complete by:  As directed    Driving restrictions   Complete by:  As directed    No driving for two weeks   TED hose   Complete by:  As directed    Use stockings (TED hose) for three weeks on both leg(s).  You may remove them at night for sleeping.   Weight bearing as tolerated   Complete by:  As directed      Allergies as of 08/04/2018      Reactions   Peanuts [peanut  Oil] Diarrhea   Shrimp [shellfish Allergy] Nausea And Vomiting      Medication List    STOP taking these medications    fish oil-omega-3 fatty acids 1000 MG capsule   ibuprofen 200 MG tablet Commonly known as:  ADVIL,MOTRIN     TAKE these medications   fluticasone 50 MCG/ACT nasal spray Commonly known as:  FLONASE Place 1 spray into both nostrils 2 (two) times daily as needed for allergies or rhinitis.   hydrochlorothiazide 12.5 MG capsule Commonly known as:  MICROZIDE Take 12.5 mg by mouth daily.   HYDROcodone-acetaminophen 5-325 MG tablet Commonly known as:  NORCO/VICODIN Take 1-2 tablets by mouth every 6 (six) hours as needed for severe pain.   irbesartan 300 MG tablet Commonly known as:  AVAPRO Take 300 mg by mouth daily.   methocarbamol 500 MG tablet Commonly known as:  ROBAXIN Take 1 tablet (500 mg total) by mouth every 6 (six) hours as needed for muscle spasms.   rivaroxaban 10 MG Tabs tablet Commonly known as:  XARELTO Take 1 tablet (10 mg total) by mouth daily with breakfast for 20 days. Then take one 81 mg aspirin once a day for three weeks. Then discontinue aspirin.   rosuvastatin 10 MG tablet Commonly known as:  Crestor Take 1 tablet (10 mg total) by mouth daily. What changed:  when to take this   traMADol 50 MG tablet Commonly known as:  ULTRAM Take 1-2 tablets (50-100 mg total) by mouth every 6 (six) hours as needed for moderate pain.            Discharge Care Instructions  (From admission, onward)         Start     Ordered   08/04/18 0000  Weight bearing as tolerated     08/04/18 0715   08/04/18 0000  Change dressing    Comments:  You may change your dressing on Friday, then change the dressing daily with sterile 4 x 4 inch gauze dressing and paper tape.   08/04/18 0715         Follow-up Information    Ollen Gross, MD. Schedule an appointment as soon as possible for a visit on 08/18/2018.   Specialty:  Orthopedic Surgery Contact information: [address] [place] [address] [phone]           Signed: Arther Abbott,  PA-C Orthopedic Surgery 08/04/2018, 3:30 PM

## 2018-08-04 NOTE — TOC Initial Note (Signed)
Transition of Care [facility]) - Initial/Assessment Note    Patient Details  Name: Jeffery Suarez MRN: [id number] Date of Birth: [birth year]  Transition of Care [facility]) CM/SW Contact:    Clearance Coots, LCSW Phone Number: 08/04/2018, 10:37 AM  Clinical Narrative:                 CSW arranged Home Health agency [facility] for physical therapy. Patient notified that [facility] will reach out to him.    Expected Discharge Plan: Home w Home Health Services Barriers to Discharge: No Barriers Identified   Patient Goals and CMS Choice Patient states their goals for this hospitalization and ongoing recovery are:: Get better CMS Medicare.gov Compare Post Acute Care list provided to:: Patient Choice offered to / list presented to : Patient  Expected Discharge Plan and Services Expected Discharge Plan: Home w Home Health Services Discharge Planning Services: CM Consult Post Acute Care Choice: Home Health Living arrangements for the past 2 months: Single Family Home Expected Discharge Date: 08/04/18               DME Arranged: Cherre Huger DME Agency: [facility] HH Arranged: PT HH Agency: [facility] (formerly [facility])  Prior Living Arrangements/Services Living arrangements for the past 2 months: Single Family Home Lives with:: Significant Other Patient language and need for interpreter reviewed:: No Do you feel safe going back to the place where you live?: Yes      Need for Family Participation in Patient Care: Yes (Comment) Care giver support system in place?: Yes (comment) Current home services: Home PT Criminal Activity/Legal Involvement Pertinent to Current Situation/Hospitalization: No - Comment as needed  Activities of Daily Living Home Assistive Devices/Equipment: Eyeglasses, Environmental consultant (specify type) ADL Screening (condition at time of admission) Patient's cognitive ability adequate to safely complete daily activities?: Yes Is the patient deaf or have difficulty  hearing?: No Does the patient have difficulty seeing, even when wearing glasses/contacts?: No Does the patient have difficulty concentrating, remembering, or making decisions?: No Patient able to express need for assistance with ADLs?: Yes Does the patient have difficulty dressing or bathing?: No Independently performs ADLs?: Yes (appropriate for developmental age) Does the patient have difficulty walking or climbing stairs?: No Weakness of Legs: None Weakness of Arms/Hands: None  Permission Sought/Granted         Permission granted to share info w AGENCY: [facility]         Emotional Assessment Appearance:: Appears stated age   Affect (typically observed): Accepting, Calm Orientation: : Oriented to Self, Oriented to Place, Oriented to  Time, Oriented to Situation Alcohol / Substance Use: Not Applicable Psych Involvement: No (comment)  Admission diagnosis:  left hip osteoarthritis Patient Active Problem List   Diagnosis Date Noted  . OA (osteoarthritis) of hip 08/03/2018  . Obesity (BMI 30-39.9) 03/18/2018  . Occlusion and stenosis of carotid artery without mention of cerebral infarction 07/22/2011  . Hypertension 12/11/2010  . Peripheral vascular disease (HCC) 12/11/2010  . TIA (transient ischemic attack) 12/11/2010  . Hyperlipidemia 12/11/2010   PCP:  Junie Spencer, FNP Pharmacy:   [facility] DELIVERY - [address] [place] - [address] [place] [address] Phone: [phone] Fax: [phone]  [facility] [address], [place] - [address] [place] [address] [place] [address] [place] [address] Phone: [phone] Fax: [phone]     Social Determinants of Health (SDOH) Interventions    Readmission Risk Interventions 30  Day Unplanned Readmission Risk Score     Admission (Current) from 08/03/2018 in [facility]  30 Day Unplanned Readmission Risk Score (%)  10 Filed at 08/04/2018 0801     This  score is the patient's risk of an unplanned readmission within 30 days of being discharged (0 -100%). The score is based on dignosis, age, lab data, medications, orders, and past utilization.   Low:  0-14.9   Medium: 15-21.9   High: 22-29.9   Extreme: 30 and above       No flowsheet data found.

## 2018-08-04 NOTE — Progress Notes (Signed)
Physical Therapy Treatment Patient Details Name: Jeffery Suarez MRN: [id number] DOB: 02-Apr-1936 Today's Date: 08/04/2018    History of Present Illness 83 yo male s/p L DA-THA on 08/03/18. PMH includes obesity, HTN, PVD with occluded internal carotid artery, TIA, GERD, HLD.    PT Comments    Pt progressing well with mobility and hopeful for dc home this pm  Follow Up Recommendations  Home health PT;Supervision for mobility/OOB     Equipment Recommendations  3in1 (PT)    Recommendations for Other Services       Precautions / Restrictions Precautions Precautions: Fall Restrictions Weight Bearing Restrictions: No Other Position/Activity Restrictions: WBAT     Mobility  Bed Mobility Overal bed mobility: Needs Assistance Bed Mobility: Supine to Sit     Supine to sit: Min assist;Mod assist     General bed mobility comments: increased time with cues for sequence and use of R LE to self assist  Transfers Overall transfer level: Needs assistance Equipment used: Rolling walker (2 wheeled) Transfers: Sit to/from Stand Sit to Stand: Min assist         General transfer comment: cues for LE management and use of UEs to self assist  Ambulation/Gait Ambulation/Gait assistance: Min assist Gait Distance (Feet): 150 Feet Assistive device: Rolling walker (2 wheeled) Gait Pattern/deviations: Step-to pattern;Decreased stride length;Decreased stance time - left;Decreased weight shift to left;Trunk flexed Gait velocity: decr    General Gait Details: cues for sequence, posture and position from [employer]             Wheelchair Mobility    Modified Rankin (Stroke Patients Only)       Balance Overall balance assessment: Mild deficits observed, not formally tested                                          Cognition Arousal/Alertness: Awake/alert Behavior During Therapy: WFL for tasks assessed/performed Overall Cognitive Status: Within Functional  Limits for tasks assessed                                        Exercises Total Joint Exercises Ankle Circles/Pumps: AROM;Both;15 reps;Supine Quad Sets: AROM;Both;10 reps;Supine Heel Slides: AAROM;Left;20 reps;Supine Hip ABduction/ADduction: AAROM;Left;15 reps;Supine Long Arc Quad: AAROM;AROM;Left;10 reps;Seated    General Comments        Pertinent Vitals/Pain Pain Assessment: 0-10 Pain Score: 4  Pain Location: L hip  Pain Descriptors / Indicators: Sore;Tightness Pain Intervention(s): Limited activity within patient's tolerance;Monitored during session;Premedicated before session;Ice applied    Home Living                      Prior Function            PT Goals (current goals can now be found in the care plan section) Acute Rehab PT Goals Patient Stated Goal: return to PLOF PT Goal Formulation: With patient Time For Goal Achievement: 08/10/18 Potential to Achieve Goals: Good Progress towards PT goals: Progressing toward goals    Frequency    7X/week      PT Plan Current plan remains appropriate    Co-evaluation              AM-PAC PT "6 Clicks" Mobility   Outcome Measure  Help needed turning from your back to your  side while in a flat bed without using bedrails?: A Little Help needed moving from lying on your back to sitting on the side of a flat bed without using bedrails?: A Little Help needed moving to and from a bed to a chair (including a wheelchair)?: A Little Help needed standing up from a chair using your arms (e.g., wheelchair or bedside chair)?: A Little Help needed to walk in hospital room?: A Little Help needed climbing 3-5 steps with a railing? : A Little 6 Click Score: 18    End of Session Equipment Utilized During Treatment: Gait belt Activity Tolerance: Patient tolerated treatment well Patient left: in chair;with call bell/phone within reach;with chair alarm set Nurse Communication: Mobility status PT  Visit Diagnosis: Other abnormalities of gait and mobility (R26.89);Difficulty in walking, not elsewhere classified (R26.2)     Time: 1020-1055 PT Time Calculation (min) (ACUTE ONLY): 35 min  Charges:  $Gait Training: 8-22 mins $Therapeutic Exercise: 8-22 mins                     Mauro Kaufmann PT Acute Rehabilitation Services Pager [phone] Office [phone]    Caroleen Stoermer 08/04/2018, 2:47 PM

## 2018-08-04 NOTE — Progress Notes (Signed)
Physical Therapy Treatment Patient Details Name: Jeffery Suarez MRN: [id number] DOB: November 29, 1935 Today's Date: 08/04/2018    History of Present Illness 83 yo male s/p L DA-THA on 08/03/18. PMH includes obesity, HTN, PVD with occluded internal carotid artery, TIA, GERD, HLD.    PT Comments    Pt continues to progress with mobility.  Spouse present to review stairs, car transfers and lower body dressing.   Follow Up Recommendations  Home health PT;Supervision for mobility/OOB     Equipment Recommendations  3in1 (PT)    Recommendations for Other Services       Precautions / Restrictions Precautions Precautions: Fall Restrictions Weight Bearing Restrictions: No Other Position/Activity Restrictions: WBAT     Mobility  Bed Mobility Overal bed mobility: Needs Assistance Bed Mobility: Supine to Sit;Sit to Supine;Rolling Rolling: Min assist   Supine to sit: Min guard Sit to supine: Min assist   General bed mobility comments: increased time with cues for sequence and use of R LE to self assist  Transfers Overall transfer level: Needs assistance Equipment used: Rolling walker (2 wheeled) Transfers: Sit to/from Stand Sit to Stand: Min guard         General transfer comment: cues for LE management and use of UEs to self assist  Ambulation/Gait Ambulation/Gait assistance: Min guard Gait Distance (Feet): 100 Feet Assistive device: Rolling walker (2 wheeled) Gait Pattern/deviations: Step-to pattern;Decreased stride length;Decreased stance time - left;Decreased weight shift to left;Trunk flexed Gait velocity: decr    General Gait Details: cues for sequence, posture and position from [employer] Stairs: Yes Stairs assistance: Min assist Stair Management: Two rails;No rails;Backwards;Forwards;Step to pattern;With walker Number of Stairs: 6 General stair comments: single step bkwd for stool into high bed; 5 steps fwd with bil rails and cues for sequence   Wheelchair  Mobility    Modified Rankin (Stroke Patients Only)       Balance Overall balance assessment: Mild deficits observed, not formally tested                                          Cognition Arousal/Alertness: Awake/alert Behavior During Therapy: WFL for tasks assessed/performed Overall Cognitive Status: Within Functional Limits for tasks assessed                                        Exercises Total Joint Exercises Ankle Circles/Pumps: AROM;Both;15 reps;Supine Quad Sets: AROM;Both;10 reps;Supine Heel Slides: AAROM;Left;20 reps;Supine Hip ABduction/ADduction: AAROM;Left;15 reps;Supine Long Arc Quad: AAROM;AROM;Left;10 reps;Seated    General Comments        Pertinent Vitals/Pain Pain Assessment: 0-10 Pain Score: 4  Pain Location: L hip  Pain Descriptors / Indicators: Sore;Tightness Pain Intervention(s): Premedicated before session;Monitored during session;Limited activity within patient's tolerance;Ice applied    Home Living                      Prior Function            PT Goals (current goals can now be found in the care plan section) Acute Rehab PT Goals Patient Stated Goal: return to PLOF PT Goal Formulation: With patient Time For Goal Achievement: 08/10/18 Potential to Achieve Goals: Good Progress towards PT goals: Progressing toward goals    Frequency    7X/week  PT Plan Current plan remains appropriate    Co-evaluation              AM-PAC PT "6 Clicks" Mobility   Outcome Measure  Help needed turning from your back to your side while in a flat bed without using bedrails?: A Little Help needed moving from lying on your back to sitting on the side of a flat bed without using bedrails?: A Little Help needed moving to and from a bed to a chair (including a wheelchair)?: A Little Help needed standing up from a chair using your arms (e.g., wheelchair or bedside chair)?: A Little Help needed to walk  in hospital room?: A Little Help needed climbing 3-5 steps with a railing? : A Little 6 Click Score: 18    End of Session Equipment Utilized During Treatment: Gait belt Activity Tolerance: Patient tolerated treatment well Patient left: Other (comment)(sitting EOB) Nurse Communication: Mobility status PT Visit Diagnosis: Other abnormalities of gait and mobility (R26.89);Difficulty in walking, not elsewhere classified (R26.2)     Time: 9675-9163 PT Time Calculation (min) (ACUTE ONLY): 36 min  Charges:  $Gait Training: 8-22 mins $Therapeutic Exercise: 8-22 mins $Therapeutic Activity: 8-22 mins                     Jeffery Suarez PT Acute Rehabilitation Services Pager [phone] Office [phone]    Dunbar Buras 08/04/2018, 2:52 PM

## 2018-08-04 NOTE — Plan of Care (Signed)
  Problem: Clinical Measurements: Goal: Ability to maintain clinical measurements within normal limits will improve Outcome: Progressing Goal: Will remain free from infection Outcome: Progressing Goal: Diagnostic test results will improve Outcome: Progressing Goal: Respiratory complications will improve Outcome: Progressing   Problem: Activity: Goal: Risk for activity intolerance will decrease Outcome: Progressing   Problem: Nutrition: Goal: Adequate nutrition will be maintained Outcome: Progressing   Problem: Elimination: Goal: Will not experience complications related to bowel motility Outcome: Progressing Goal: Will not experience complications related to urinary retention Outcome: Progressing   Problem: Elimination: Goal: Will not experience complications related to bowel motility Outcome: Progressing Goal: Will not experience complications related to urinary retention Outcome: Progressing   Problem: Pain Managment: Goal: General experience of comfort will improve Outcome: Progressing   Problem: Education: Goal: Knowledge of the prescribed therapeutic regimen will improve Outcome: Progressing Goal: Understanding of discharge needs will improve Outcome: Progressing   Problem: Safety: Goal: Ability to remain free from injury will improve Outcome: Progressing   Problem: Activity: Goal: Ability to avoid complications of mobility impairment will improve Outcome: Progressing Goal: Ability to tolerate increased activity will improve Outcome: Progressing   Problem: Pain Management: Goal: Pain level will decrease with appropriate interventions Outcome: Progressing   Problem: Clinical Measurements: Goal: Postoperative complications will be avoided or minimized Outcome: Progressing  RN will continue to educate patient

## 2018-08-05 ENCOUNTER — Encounter (HOSPITAL_COMMUNITY): Payer: Self-pay | Admitting: Orthopedic Surgery

## 2018-08-08 DIAGNOSIS — Z6835 Body mass index (BMI) 35.0-35.9, adult: Secondary | ICD-10-CM | POA: Diagnosis not present

## 2018-08-08 DIAGNOSIS — I739 Peripheral vascular disease, unspecified: Secondary | ICD-10-CM | POA: Diagnosis not present

## 2018-08-08 DIAGNOSIS — E785 Hyperlipidemia, unspecified: Secondary | ICD-10-CM | POA: Diagnosis not present

## 2018-08-08 DIAGNOSIS — Z96642 Presence of left artificial hip joint: Secondary | ICD-10-CM | POA: Diagnosis not present

## 2018-08-08 DIAGNOSIS — Z8673 Personal history of transient ischemic attack (TIA), and cerebral infarction without residual deficits: Secondary | ICD-10-CM | POA: Diagnosis not present

## 2018-08-08 DIAGNOSIS — I10 Essential (primary) hypertension: Secondary | ICD-10-CM | POA: Diagnosis not present

## 2018-08-08 DIAGNOSIS — Z9181 History of falling: Secondary | ICD-10-CM | POA: Diagnosis not present

## 2018-08-08 DIAGNOSIS — E669 Obesity, unspecified: Secondary | ICD-10-CM | POA: Diagnosis not present

## 2018-08-08 DIAGNOSIS — K219 Gastro-esophageal reflux disease without esophagitis: Secondary | ICD-10-CM | POA: Diagnosis not present

## 2018-08-08 DIAGNOSIS — Z471 Aftercare following joint replacement surgery: Secondary | ICD-10-CM | POA: Diagnosis not present

## 2018-08-08 DIAGNOSIS — I6522 Occlusion and stenosis of left carotid artery: Secondary | ICD-10-CM | POA: Diagnosis not present

## 2018-08-08 DIAGNOSIS — Z87891 Personal history of nicotine dependence: Secondary | ICD-10-CM | POA: Diagnosis not present

## 2018-08-13 DIAGNOSIS — I6522 Occlusion and stenosis of left carotid artery: Secondary | ICD-10-CM | POA: Diagnosis not present

## 2018-08-13 DIAGNOSIS — Z471 Aftercare following joint replacement surgery: Secondary | ICD-10-CM | POA: Diagnosis not present

## 2018-08-13 DIAGNOSIS — I739 Peripheral vascular disease, unspecified: Secondary | ICD-10-CM | POA: Diagnosis not present

## 2018-08-13 DIAGNOSIS — E785 Hyperlipidemia, unspecified: Secondary | ICD-10-CM | POA: Diagnosis not present

## 2018-08-13 DIAGNOSIS — K219 Gastro-esophageal reflux disease without esophagitis: Secondary | ICD-10-CM | POA: Diagnosis not present

## 2018-08-13 DIAGNOSIS — I10 Essential (primary) hypertension: Secondary | ICD-10-CM | POA: Diagnosis not present

## 2018-08-15 DIAGNOSIS — E785 Hyperlipidemia, unspecified: Secondary | ICD-10-CM | POA: Diagnosis not present

## 2018-08-15 DIAGNOSIS — I6522 Occlusion and stenosis of left carotid artery: Secondary | ICD-10-CM | POA: Diagnosis not present

## 2018-08-15 DIAGNOSIS — Z471 Aftercare following joint replacement surgery: Secondary | ICD-10-CM | POA: Diagnosis not present

## 2018-08-15 DIAGNOSIS — K219 Gastro-esophageal reflux disease without esophagitis: Secondary | ICD-10-CM | POA: Diagnosis not present

## 2018-08-15 DIAGNOSIS — I10 Essential (primary) hypertension: Secondary | ICD-10-CM | POA: Diagnosis not present

## 2018-08-15 DIAGNOSIS — I739 Peripheral vascular disease, unspecified: Secondary | ICD-10-CM | POA: Diagnosis not present

## 2018-08-17 DIAGNOSIS — K219 Gastro-esophageal reflux disease without esophagitis: Secondary | ICD-10-CM | POA: Diagnosis not present

## 2018-08-17 DIAGNOSIS — I6522 Occlusion and stenosis of left carotid artery: Secondary | ICD-10-CM | POA: Diagnosis not present

## 2018-08-17 DIAGNOSIS — Z471 Aftercare following joint replacement surgery: Secondary | ICD-10-CM | POA: Diagnosis not present

## 2018-08-17 DIAGNOSIS — E785 Hyperlipidemia, unspecified: Secondary | ICD-10-CM | POA: Diagnosis not present

## 2018-08-17 DIAGNOSIS — I739 Peripheral vascular disease, unspecified: Secondary | ICD-10-CM | POA: Diagnosis not present

## 2018-08-17 DIAGNOSIS — I10 Essential (primary) hypertension: Secondary | ICD-10-CM | POA: Diagnosis not present

## 2018-08-19 DIAGNOSIS — E785 Hyperlipidemia, unspecified: Secondary | ICD-10-CM | POA: Diagnosis not present

## 2018-08-19 DIAGNOSIS — K219 Gastro-esophageal reflux disease without esophagitis: Secondary | ICD-10-CM | POA: Diagnosis not present

## 2018-08-19 DIAGNOSIS — I739 Peripheral vascular disease, unspecified: Secondary | ICD-10-CM | POA: Diagnosis not present

## 2018-08-19 DIAGNOSIS — Z471 Aftercare following joint replacement surgery: Secondary | ICD-10-CM | POA: Diagnosis not present

## 2018-08-19 DIAGNOSIS — I10 Essential (primary) hypertension: Secondary | ICD-10-CM | POA: Diagnosis not present

## 2018-08-19 DIAGNOSIS — I6522 Occlusion and stenosis of left carotid artery: Secondary | ICD-10-CM | POA: Diagnosis not present

## 2018-09-13 DIAGNOSIS — Z471 Aftercare following joint replacement surgery: Secondary | ICD-10-CM | POA: Diagnosis not present

## 2018-09-13 DIAGNOSIS — Z96642 Presence of left artificial hip joint: Secondary | ICD-10-CM | POA: Diagnosis not present

## 2018-09-13 DIAGNOSIS — M87852 Other osteonecrosis, left femur: Secondary | ICD-10-CM | POA: Diagnosis not present

## 2018-11-16 ENCOUNTER — Other Ambulatory Visit: Payer: Self-pay

## 2018-11-17 ENCOUNTER — Encounter: Payer: Self-pay | Admitting: Family

## 2018-11-17 ENCOUNTER — Ambulatory Visit (INDEPENDENT_AMBULATORY_CARE_PROVIDER_SITE_OTHER): Payer: Medicare Other | Admitting: Family

## 2018-11-17 NOTE — Progress Notes (Signed)
   Virtual Visit via telephone Note  Attempted to call patient at :8:20 AM, no answer, VM left.  Attempted to call patient at 8:46 AM, no answer, left VM that he can call and reschedule    Evelina Dun, FNP

## 2018-12-04 DIAGNOSIS — S0501XA Injury of conjunctiva and corneal abrasion without foreign body, right eye, initial encounter: Secondary | ICD-10-CM | POA: Diagnosis not present

## 2018-12-04 DIAGNOSIS — X58XXXA Exposure to other specified factors, initial encounter: Secondary | ICD-10-CM | POA: Diagnosis not present

## 2018-12-04 DIAGNOSIS — I10 Essential (primary) hypertension: Secondary | ICD-10-CM | POA: Diagnosis not present

## 2018-12-04 DIAGNOSIS — Z79899 Other long term (current) drug therapy: Secondary | ICD-10-CM | POA: Diagnosis not present

## 2018-12-07 DIAGNOSIS — S0501XA Injury of conjunctiva and corneal abrasion without foreign body, right eye, initial encounter: Secondary | ICD-10-CM | POA: Diagnosis not present

## 2018-12-12 ENCOUNTER — Other Ambulatory Visit: Payer: Self-pay

## 2018-12-13 ENCOUNTER — Encounter: Payer: Self-pay | Admitting: Family

## 2018-12-13 ENCOUNTER — Ambulatory Visit (INDEPENDENT_AMBULATORY_CARE_PROVIDER_SITE_OTHER): Payer: Medicare Other | Admitting: Family

## 2018-12-13 VITALS — BP 147/71 | HR 92 | Temp 98.9°F | Ht 70.0 in | Wt 232.0 lb

## 2018-12-13 DIAGNOSIS — Z23 Encounter for immunization: Secondary | ICD-10-CM | POA: Diagnosis not present

## 2018-12-13 DIAGNOSIS — E669 Obesity, unspecified: Secondary | ICD-10-CM

## 2018-12-13 DIAGNOSIS — I10 Essential (primary) hypertension: Secondary | ICD-10-CM | POA: Diagnosis not present

## 2018-12-13 DIAGNOSIS — E785 Hyperlipidemia, unspecified: Secondary | ICD-10-CM | POA: Diagnosis not present

## 2018-12-13 DIAGNOSIS — I739 Peripheral vascular disease, unspecified: Secondary | ICD-10-CM

## 2018-12-13 DIAGNOSIS — M169 Osteoarthritis of hip, unspecified: Secondary | ICD-10-CM

## 2018-12-13 MED ORDER — ROSUVASTATIN CALCIUM 10 MG PO TABS: 10.0000 mg | ORAL_TABLET | Freq: Every day | ORAL | 3 refills | Status: DC

## 2018-12-13 MED ORDER — IRBESARTAN 300 MG PO TABS: 300.0000 mg | ORAL_TABLET | Freq: Every day | ORAL | 3 refills | Status: DC

## 2018-12-13 MED ORDER — HYDROCHLOROTHIAZIDE 12.5 MG PO CAPS: 12.5000 mg | ORAL_CAPSULE | Freq: Every day | ORAL | 2 refills | Status: DC

## 2018-12-13 NOTE — Patient Instructions (Signed)
Health Maintenance After Age 81 After age 81, you are at a higher risk for certain long-term diseases and infections as well as injuries from falls. Falls are a major cause of broken bones and head injuries in people who are older than age 81. Getting regular preventive care can help to keep you healthy and well. Preventive care includes getting regular testing and making lifestyle changes as recommended by your health care provider. Talk with your health care provider about:  Which screenings and tests you should have. A screening is a test that checks for a disease when you have no symptoms.  A diet and exercise plan that is right for you. What should I know about screenings and tests to prevent falls? Screening and testing are the best ways to find a health problem early. Early diagnosis and treatment give you the best chance of managing medical conditions that are common after age 81. Certain conditions and lifestyle choices may make you more likely to have a fall. Your health care provider may recommend:  Regular vision checks. Poor vision and conditions such as cataracts can make you more likely to have a fall. If you wear glasses, make sure to get your prescription updated if your vision changes.  Medicine review. Work with your health care provider to regularly review all of the medicines you are taking, including over-the-counter medicines. Ask your health care provider about any side effects that may make you more likely to have a fall. Tell your health care provider if any medicines that you take make you feel dizzy or sleepy.  Osteoporosis screening. Osteoporosis is a condition that causes the bones to get weaker. This can make the bones weak and cause them to break more easily.  Blood pressure screening. Blood pressure changes and medicines to control blood pressure can make you feel dizzy.  Strength and balance checks. Your health care provider may recommend certain tests to check your  strength and balance while standing, walking, or changing positions.  Foot health exam. Foot pain and numbness, as well as not wearing proper footwear, can make you more likely to have a fall.  Depression screening. You may be more likely to have a fall if you have a fear of falling, feel emotionally low, or feel unable to do activities that you used to do.  Alcohol use screening. Using too much alcohol can affect your balance and may make you more likely to have a fall. What actions can I take to lower my risk of falls? General instructions  Talk with your health care provider about your risks for falling. Tell your health care provider if: ? You fall. Be sure to tell your health care provider about all falls, even ones that seem minor. ? You feel dizzy, sleepy, or off-balance.  Take over-the-counter and prescription medicines only as told by your health care provider. These include any supplements.  Eat a healthy diet and maintain a healthy weight. A healthy diet includes low-fat dairy products, low-fat (lean) meats, and fiber from whole grains, beans, and lots of fruits and vegetables. Home safety  Remove any tripping hazards, such as rugs, cords, and clutter.  Install safety equipment such as grab bars in bathrooms and safety rails on stairs.  Keep rooms and walkways well-lit. Activity   Follow a regular exercise program to stay fit. This will help you maintain your balance. Ask your health care provider what types of exercise are appropriate for you.  If you need a cane or   walker, use it as recommended by your health care provider.  Wear supportive shoes that have nonskid soles. Lifestyle  Do not drink alcohol if your health care provider tells you not to drink.  If you drink alcohol, limit how much you have: ? 0-1 drink a day for women. ? 0-2 drinks a day for men.  Be aware of how much alcohol is in your drink. In the [place] one drink equals one typical bottle of beer (12  oz), one-half glass of wine (5 oz), or one shot of hard liquor (1 oz).  Do not use any products that contain nicotine or tobacco, such as cigarettes and e-cigarettes. If you need help quitting, ask your health care provider. Summary  Having a healthy lifestyle and getting preventive care can help to protect your health and wellness after age 81.  Screening and testing are the best way to find a health problem early and help you avoid having a fall. Early diagnosis and treatment give you the best chance for managing medical conditions that are more common for people who are older than age 81.  Falls are a major cause of broken bones and head injuries in people who are older than age 81. Take precautions to prevent a fall at home.  Work with your health care provider to learn what changes you can make to improve your health and wellness and to prevent falls. This information is not intended to replace advice given to you by your health care provider. Make sure you discuss any questions you have with your health care provider. Document Released: 03/17/2017 Document Revised: 08/25/2018 Document Reviewed: 03/17/2017 [employer]vier Patient Education  2020 [employer]  

## 2018-12-13 NOTE — Progress Notes (Signed)
Subjective:    Patient ID: Jeffery Suarez, male    DOB: [birth year], 83 y.o.   MRN: [id number]  Chief Complaint  Patient presents with  . Medical Management of Chronic Issues   Pt presents to the office today for chronic follow up. He had a total hip replacement on 08/03/18. He states he is doing to better and able to move around much better. He continues to having intermittent aching pain 2 out 10. Hypertension This is a chronic problem. The current episode started more than 1 year ago. The problem has been waxing and waning since onset. The problem is uncontrolled. Associated symptoms include headaches, malaise/fatigue and shortness of breath. Pertinent negatives include no peripheral edema. Risk factors for coronary artery disease include dyslipidemia. The current treatment provides moderate improvement. Hypertensive end-organ damage includes CAD/MI.  Arthritis Presents for follow-up visit. He complains of pain and stiffness. The symptoms have been stable. Affected locations include the right knee, left knee, left hip and right hip. His pain is at a severity of 5/10.  Hyperlipidemia This is a chronic problem. The current episode started more than 1 year ago. The problem is uncontrolled. Recent lipid tests were reviewed and are high. Exacerbating diseases include obesity. Associated symptoms include shortness of breath. Current antihyperlipidemic treatment includes statins. The current treatment provides moderate improvement of lipids. Risk factors for coronary artery disease include dyslipidemia, male sex and a sedentary lifestyle.      Review of Systems  Constitutional: Positive for malaise/fatigue.  Respiratory: Positive for shortness of breath.   Musculoskeletal: Positive for arthritis and stiffness.  Neurological: Positive for headaches.  All other systems reviewed and are negative.      Objective:   Physical Exam Vitals signs reviewed.  Constitutional:      General: He is not  in acute distress.    Appearance: He is well-developed.  HENT:     Head: Normocephalic.     Right Ear: Tympanic membrane normal.     Left Ear: Tympanic membrane normal.  Eyes:     General:        Right eye: No discharge.        Left eye: No discharge.     Pupils: Pupils are equal, round, and reactive to light.  Neck:     Musculoskeletal: Normal range of motion and neck supple.     Thyroid: No thyromegaly.  Cardiovascular:     Rate and Rhythm: Normal rate and regular rhythm.     Heart sounds: Normal heart sounds. No murmur.  Pulmonary:     Effort: Pulmonary effort is normal. No respiratory distress.     Breath sounds: Normal breath sounds. No wheezing.  Abdominal:     General: Bowel sounds are normal. There is no distension.     Palpations: Abdomen is soft.     Tenderness: There is no abdominal tenderness.  Musculoskeletal:        General: Tenderness present.     Comments: Pain in left pain with extension, flexion, and rotation.   Skin:    General: Skin is warm and dry.     Findings: No erythema or rash.  Neurological:     Mental Status: He is alert and oriented to person, place, and time.     Cranial Nerves: No cranial nerve deficit.     Deep Tendon Reflexes: Reflexes are normal and symmetric.  Psychiatric:        Behavior: Behavior normal.        Thought Content:  Thought content normal.        Judgment: Judgment normal.       BP (!) 147/71   Pulse 92   Temp 98.9 F (37.2 C) (Oral)   Ht '5\' 10"'  (1.778 m)   Wt 232 lb (105.2 kg)   BMI 33.29 kg/m      Assessment & Plan:  Jeffery Suarez comes in today with chief complaint of Medical Management of Chronic Issues   Diagnosis and orders addressed:  1. Osteoarthritis of hip, unspecified laterality, unspecified osteoarthritis type - CMP14+EGFR - CBC with Differential/Platelet  2. Essential hypertension - CMP14+EGFR - CBC with Differential/Platelet - irbesartan (AVAPRO) 300 MG tablet; Take 1 tablet (300 mg  total) by mouth daily.  Dispense: 90 tablet; Refill: 3 - hydrochlorothiazide (MICROZIDE) 12.5 MG capsule; Take 1 capsule (12.5 mg total) by mouth daily.  Dispense: 90 capsule; Refill: 2  3. Obesity (BMI 30-39.9) - CMP14+EGFR - CBC with Differential/Platelet  4. Hyperlipidemia, unspecified hyperlipidemia type - CMP14+EGFR - CBC with Differential/Platelet - Lipid panel - rosuvastatin (CRESTOR) 10 MG tablet; Take 1 tablet (10 mg total) by mouth daily.  Dispense: 90 tablet; Refill: 3  5. Peripheral vascular disease (HCC) - rosuvastatin (CRESTOR) 10 MG tablet; Take 1 tablet (10 mg total) by mouth daily.  Dispense: 90 tablet; Refill: 3   Labs pending Health Maintenance reviewed Diet and exercise encouraged  Follow up plan: 6 months    Jeffery Dun, FNP

## 2018-12-13 NOTE — Addendum Note (Signed)
Addended by: Shelbie Ammons on: 12/13/2018 09:58 AM   Modules accepted: Orders

## 2018-12-14 LAB — CMP14+EGFR
ALT: 37 IU/L (ref 0–44)
AST: 26 IU/L (ref 0–40)
Albumin/Globulin Ratio: 1.6 (ref 1.2–2.2)
Albumin: 4.4 g/dL (ref 3.6–4.6)
Alkaline Phosphatase: 82 IU/L (ref 39–117)
BUN/Creatinine Ratio: 28 — ABNORMAL HIGH (ref 10–24)
BUN: 27 mg/dL (ref 8–27)
Bilirubin Total: 0.8 mg/dL (ref 0.0–1.2)
CO2: 22 mmol/L (ref 20–29)
Calcium: 10.1 mg/dL (ref 8.6–10.2)
Chloride: 99 mmol/L (ref 96–106)
Creatinine, Ser: 0.96 mg/dL (ref 0.76–1.27)
GFR calc Af Amer: 85 mL/min/{1.73_m2} (ref >59)
GFR calc non Af Amer: 74 mL/min/{1.73_m2} (ref >59)
Globulin, Total: 2.7 g/dL (ref 1.5–4.5)
Glucose: 113 mg/dL — ABNORMAL HIGH (ref 65–99)
Potassium: 4.6 mmol/L (ref 3.5–5.2)
Sodium: 140 mmol/L (ref 134–144)
Total Protein: 7.1 g/dL (ref 6.0–8.5)

## 2018-12-14 LAB — CBC WITH DIFFERENTIAL/PLATELET
Basophils Absolute: 0 10*3/uL (ref 0.0–0.2)
Basos: 1 %
EOS (ABSOLUTE): 0.1 10*3/uL (ref 0.0–0.4)
Eos: 2 %
Hematocrit: 39.5 % (ref 37.5–51.0)
Hemoglobin: 13.1 g/dL (ref 13.0–17.7)
Immature Grans (Abs): 0 10*3/uL (ref 0.0–0.1)
Immature Granulocytes: 0 %
Lymphocytes Absolute: 1.4 10*3/uL (ref 0.7–3.1)
Lymphs: 24 %
MCH: 28.7 pg (ref 26.6–33.0)
MCHC: 33.2 g/dL (ref 31.5–35.7)
MCV: 86 fL (ref 79–97)
Monocytes Absolute: 0.8 10*3/uL (ref 0.1–0.9)
Monocytes: 14 %
Neutrophils Absolute: 3.4 10*3/uL (ref 1.4–7.0)
Neutrophils: 59 %
Platelets: 257 10*3/uL (ref 150–450)
RBC: 4.57 x10E6/uL (ref 4.14–5.80)
RDW: 13.2 % (ref 11.6–15.4)
WBC: 5.7 10*3/uL (ref 3.4–10.8)

## 2018-12-14 LAB — LIPID PANEL
Chol/HDL Ratio: 4.3 ratio (ref 0.0–5.0)
Cholesterol, Total: 120 mg/dL (ref 100–199)
HDL: 28 mg/dL — ABNORMAL LOW (ref >39)
LDL Calculated: 55 mg/dL (ref 0–99)
Triglycerides: 186 mg/dL — ABNORMAL HIGH (ref 0–149)
VLDL Cholesterol Cal: 37 mg/dL (ref 5–40)

## 2018-12-15 DIAGNOSIS — S058X1D Other injuries of right eye and orbit, subsequent encounter: Secondary | ICD-10-CM | POA: Diagnosis not present

## 2018-12-23 ENCOUNTER — Encounter: Payer: Self-pay | Admitting: *Deleted

## 2018-12-23 ENCOUNTER — Ambulatory Visit (INDEPENDENT_AMBULATORY_CARE_PROVIDER_SITE_OTHER): Payer: Medicare Other | Admitting: *Deleted

## 2018-12-23 VITALS — Ht 70.0 in | Wt 231.9 lb

## 2018-12-23 DIAGNOSIS — Z0000 Encounter for general adult medical examination without abnormal findings: Secondary | ICD-10-CM

## 2018-12-23 NOTE — Progress Notes (Signed)
MEDICARE ANNUAL WELLNESS VISIT  12/23/2018  Telephone Visit Disclaimer This Medicare AWV was conducted by telephone due to national recommendations for restrictions regarding the COVID-19 Pandemic (e.g. social distancing).  I verified, using two identifiers, that I am speaking with Jeffery Suarez or their authorized healthcare agent. I discussed the limitations, risks, security, and privacy concerns of performing an evaluation and management service by telephone and the potential availability of an in-person appointment in the future. The patient expressed understanding and agreed to proceed.   Subjective:  Jeffery Suarez is a 83 y.o. male patient of Hawks, Edilia Bohristy A, FNP who had a Medicare Annual Wellness Visit today via telephone. Jeffery Suarez is Retired and lives with an adult companion. he has 1 child. he reports that he is socially active and does interact with friends/family regularly. 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  Patient Care Team: Junie SpencerHawks, Christy A, FNP as PCP - General (Family Medicine)  Advanced Directives 12/23/2018 08/03/2018 07/28/2018  Does Patient Have a Medical Advance Directive? Yes Yes Yes  Type of Estate agentAdvance Directive Healthcare Power of [place];Living will Living will;Healthcare Power of Attorney Living will;Healthcare Power of Attorney  Does patient want to make changes to medical advance directive? No - Patient declined No - Patient declined No - Patient declined  Copy of Healthcare Power of Attorney in Chart? No - copy requested No - copy requested No - copy requested    Hospital Utilization Over the Past 12 Months: # of hospitalizations or ER visits: 1 # of surgeries: 1  Review of Systems    Patient reports that his overall health is worse compared to last year.  Patient Reported Readings (BP, Pulse, CBG, Weight, etc) none  Review of Systems: History obtained from chart review and the patient General ROS: positive for  -  fatigue and weight loss Respiratory ROS: positive for - shortness of breath  All other systems negative.  Pain Assessment Pain : No/denies pain     Current Medications & Allergies (verified) Allergies as of 12/23/2018      Reactions   Peanuts [peanut Oil] Diarrhea   Shrimp [shellfish Allergy] Nausea And Vomiting      Medication List       Accurate as of December 23, 2018  8:49 AM. If you have any questions, ask your nurse or doctor.        hydrochlorothiazide 12.5 MG capsule Commonly known as: MICROZIDE Take 1 capsule (12.5 mg total) by mouth daily.   irbesartan 300 MG tablet Commonly known as: AVAPRO Take 1 tablet (300 mg total) by mouth daily.   rosuvastatin 10 MG tablet Commonly known as: Crestor Take 1 tablet (10 mg total) by mouth daily.       History (reviewed): Past Medical History:  Diagnosis Date  . Arthritis   . Carotid artery occlusion   . GERD (gastroesophageal reflux disease)   . Hx-TIA (transient ischemic attack)   . Hyperlipidemia   . Hypertension   . PVD (peripheral vascular disease) (HCC)    OCCLUDED LEFT INTERNAL CAROTID ARTERY   Past Surgical History:  Procedure Laterality Date  . APPENDECTOMY    . CARDIOVASCULAR STRESS TEST  06/07/2006   EF 63%  . EYE SURGERY     bilateral cataract surgery with lens implants  . TOTAL HIP ARTHROPLASTY Left 08/03/2018   Procedure: TOTAL HIP ARTHROPLASTY ANTERIOR APPROACH;  Surgeon: Ollen GrossAluisio, Frank, MD;  Location: [facility];  Service: Orthopedics;  Laterality: Left;  100min  . US ECHOCARDIOGRAPHY  02/18/2006   EF 55-60%   Family History  Problem Relation Age of Onset  . Heart attack Brother   . Cancer Sister   . Hypertension Mother    Social History   Socioeconomic History  . Marital status: Significant Other    Spouse name: Not on file  . Number of children: 1  . Years of education: Not on file  . Highest education level: Some college, no degree  Occupational History  . Occupation: Retired  Sports coachocial  Needs  . Financial resource strain: Not hard at all  . Food insecurity    Worry: Never true    Inability: Never true  . Transportation needs    Medical: No    Non-medical: No  Tobacco Use  . Smoking status: Former Smoker    Quit date: 05/19/1983    Years since quitting: 35.6  . Smokeless tobacco: Never Used  Substance and Sexual Activity  . Alcohol use: No  . Drug use: No  . Sexual activity: Not Currently  Lifestyle  . Physical activity    Days per week: 0 days    Minutes per session: 0 min  . Stress: Not at all  Relationships  . Social connections    Talks on phone: More than three times a week    Gets together: More than three times a week    Attends religious service: More than 4 times per year    Active member of club or organization: Yes    Attends meetings of clubs or organizations: More than 4 times per year    Relationship status: Living with partner  Other Topics Concern  . Not on file  Social History Narrative  . Not on file    Activities of Daily Living In your present state of health, do you have any difficulty performing the following activities: 12/23/2018 08/03/2018  Hearing? N N  Vision? N N  Difficulty concentrating or making decisions? N N  Walking or climbing stairs? N N  Dressing or bathing? N N  Doing errands, shopping? N N  Preparing Food and eating ? N -  Using the Toilet? N -  In the past six months, have you accidently leaked urine? N -  Do you have problems with loss of bowel control? N -  Managing your Medications? N -  Managing your Finances? N -  Housekeeping or managing your Housekeeping? N -  Some recent data might be hidden    Patient Education/ Literacy How often do you need to have someone help you when you read instructions, pamphlets, or other written materials from your doctor or pharmacy?: 1 - Never What is the last grade level you completed in school?: 9th Grade  Exercise Current Exercise Habits: The patient does not  participate in regular exercise at present, Exercise limited by: None identified  Diet Patient reports consuming 2 meals a day and 2 snack(s) a day Patient reports that his primary diet is: Regular Patient reports that she does have regular access to food.   Depression Screen PHQ 2/9 Scores 12/23/2018 12/13/2018 05/05/2018 03/25/2018 03/18/2018  PHQ - 2 Score 0 0 0 0 0     Fall Risk Fall Risk  12/23/2018 12/13/2018 05/05/2018 03/25/2018 03/18/2018  Falls in the past year? 0 0 0 0 0  Number falls in past yr: 0 - - - -  Injury with Fall? 0 - - - -     Objective:  Jeffery Suarez seemed  alert and oriented and he participated appropriately during our telephone visit.  Blood Pressure Weight BMI  BP Readings from Last 3 Encounters:  12/13/18 (!) 147/71  08/04/18 (!) 163/74  07/29/18 132/80   Wt Readings from Last 3 Encounters:  12/23/18 231 lb 14.8 oz (105.2 kg)  12/13/18 232 lb (105.2 kg)  08/03/18 247 lb 9.6 oz (112.3 kg)   BMI Readings from Last 1 Encounters:  12/23/18 33.28 kg/m    *Unable to obtain current vital signs, weight, and BMI due to telephone visit type  Hearing/Vision  . Jeffery Suarez did not seem to have difficulty with hearing/understanding during the telephone conversation . Reports that he has not had a formal eye exam by an eye care professional within the past year . Reports that he has not had a formal hearing evaluation within the past year *Unable to fully assess hearing and vision during telephone visit type  Cognitive Function: 6CIT Screen 12/23/2018  What Year? 0 points  What month? 0 points  What time? 0 points  Count back from 20 0 points  Months in reverse 0 points  Repeat phrase 0 points  Total Score 0   (Normal:0-7, Significant for Dysfunction: >8)  Normal Cognitive Function Screening: Yes   Immunization & Health Maintenance Record Immunization History  Administered Date(s) Administered  . Influenza, High Dose Seasonal PF 07/02/2015, 02/10/2017,  03/18/2018  . Influenza-Unspecified 04/26/2014  . Pneumococcal Conjugate-13 08/12/2016  . Pneumococcal Polysaccharide-23 12/13/2018    Health Maintenance  Topic Date Due  . TETANUS/TDAP  05/23/1954  . INFLUENZA VACCINE  12/17/2018  . PNA vac Low Risk Adult  Completed       Assessment  This is a routine wellness examination for Jeffery Suarez.  Health Maintenance: Due or Overdue Health Maintenance Due  Topic Date Due  . TETANUS/TDAP  05/23/1954  . INFLUENZA VACCINE  12/17/2018    Jeffery Suarez does not need a referral for Community Assistance: Care Management:   no Social Work:    no Prescription Assistance:  no Nutrition/Diabetes Education:  no   Plan:  Personalized Goals Goals Addressed            This Visit's Progress   . Exercise 3x per week (30 min per time)       Try to exercise for at least 30 minutes 3 times weekly      Personalized Health Maintenance & Screening Recommendations  Influenza vaccine Td vaccine  Lung Cancer Screening Recommended: no (Low Dose CT Chest recommended if Age 84-80 years, 30 pack-year currently smoking OR have quit w/in past 15 years) Hepatitis C Screening recommended: no HIV Screening recommended: no  Advanced Directives: Written information was not prepared per patient's request.  Referrals & Orders No orders of the defined types were placed in this encounter.   Follow-up Plan . Follow-up with Junie SpencerHawks, Christy A, FNP as planned  I have personally reviewed and noted the following in the patient's chart:   . Medical and social history . Use of alcohol, tobacco or illicit drugs  . Current medications and supplements . Functional ability and status . Nutritional status . Physical activity . Advanced directives . List of other physicians . Hospitalizations, surgeries, and ER visits in previous 12 months . Vitals . Screenings to include cognitive, depression, and falls . Referrals and appointments  In addition, I  have reviewed and discussed with Jeffery Suarez certain preventive protocols, quality metrics, and best practice recommendations. A written personalized care plan for preventive services as well  as general preventive health recommendations is available and can be mailed to the patient at his request.      Wardell Heath, LPN  01/17/3299

## 2018-12-23 NOTE — Patient Instructions (Signed)
[name] , Thank you for taking time to come for your Medicare Wellness Visit. I appreciate your ongoing commitment to your health goals. Please review the following plan we discussed and let me know if I can assist you in the future.   These are the goals we discussed: Goals    . Exercise 3x per week (30 min per time)     Try to exercise for at least 30 minutes 3 times weekly       This is a list of the screening recommended for you and due dates:  Health Maintenance  Topic Date Due  . Tetanus Vaccine  05/23/1954  . Flu Shot  12/17/2018  . Pneumonia vaccines  Completed     BMI for Adults  Body mass index (BMI) is a number that is calculated from a person's weight and height. BMI may help to estimate how much of a person's weight is composed of fat. BMI can help identify those who may be at higher risk for certain medical problems. How is BMI used with adults? BMI is used as a screening tool to identify possible weight problems. It is used to check whether a person is obese, overweight, healthy weight, or underweight. How is BMI calculated? BMI measures your weight and compares it to your height. This can be done either in [place] ([place] or metric measurements. Note that charts are available to help you find your BMI quickly and easily without having to do these calculations yourself. To calculate your BMI in English ([place] measurements, your health care provider will: 1. Measure your weight in pounds (lb). 2. Multiply the number of pounds by 703. ? For example, for a person who weighs 180 lb, multiply that number by 703, which equals 126,540. 3. Measure your height in inches (in). Then multiply that number by itself to get a measurement called "inches squared." ? For example, for a person who is 70 in tall, the "inches squared" measurement is 70 in x 70 in, which equals 4900 inches squared. 4. Divide the total from Step 2 (number of lb x 703) by the total from Step 3 (inches  squared): 126,540  4900 = 25.8. This is your BMI. To calculate your BMI in metric measurements, your health care provider will: 1. Measure your weight in kilograms (kg). 2. Measure your height in meters (m). Then multiply that number by itself to get a measurement called "meters squared." ? For example, for a person who is 1.75 m tall, the "meters squared" measurement is 1.75 m x 1.75 m, which is equal to 3.1 meters squared. 3. Divide the number of kilograms (your weight) by the meters squared number. In this example: 70  3.1 = 22.6. This is your BMI. How is BMI interpreted? To interpret your results, your health care provider will use BMI charts to identify whether you are underweight, normal weight, overweight, or obese. The following guidelines will be used:  Underweight: BMI less than 18.5.  Normal weight: BMI between 18.5 and 24.9.  Overweight: BMI between 25 and 29.9.  Obese: BMI of 30 and above. Please note:  Weight includes both fat and muscle, so someone with a muscular build, such as an athlete, may have a BMI that is higher than 24.9. In cases like these, BMI is not an accurate measure of body fat.  To determine if excess body fat is the cause of a BMI of 25 or higher, further assessments may need to be done by a health care  provider.  BMI is usually interpreted in the same way for men and women. Why is BMI a useful tool? BMI is useful in two ways:  Identifying a weight problem that may be related to a medical condition, or that may increase the risk for medical problems.  Promoting lifestyle and diet changes in order to reach a healthy weight. Summary  Body mass index (BMI) is a number that is calculated from a person's weight and height.  BMI may help to estimate how much of a person's weight is composed of fat. BMI can help identify those who may be at higher risk for certain medical problems.  BMI can be measured using English measurements or metric measurements.   To interpret your results, your health care provider will use BMI charts to identify whether you are underweight, normal weight, overweight, or obese. This information is not intended to replace advice given to you by your health care provider. Make sure you discuss any questions you have with your health care provider. Document Released: 01/14/2004 Document Revised: 04/16/2017 Document Reviewed: 03/17/2017 Elsevier Patient Education  2020 [employer].

## 2019-04-06 ENCOUNTER — Other Ambulatory Visit: Payer: Self-pay | Admitting: Family

## 2019-04-06 DIAGNOSIS — I10 Essential (primary) hypertension: Secondary | ICD-10-CM

## 2019-04-06 DIAGNOSIS — I739 Peripheral vascular disease, unspecified: Secondary | ICD-10-CM

## 2019-04-06 DIAGNOSIS — E785 Hyperlipidemia, unspecified: Secondary | ICD-10-CM

## 2019-04-06 MED ORDER — IRBESARTAN 300 MG PO TABS: 300.0000 mg | ORAL_TABLET | Freq: Every day | ORAL | 2 refills | Status: DC

## 2019-04-06 NOTE — Telephone Encounter (Signed)
Pt needs to call to get refills, instructed patient to talk with a representative

## 2019-04-12 ENCOUNTER — Other Ambulatory Visit: Payer: Self-pay | Admitting: Family

## 2019-04-12 DIAGNOSIS — I10 Essential (primary) hypertension: Secondary | ICD-10-CM

## 2019-08-10 DIAGNOSIS — Z23 Encounter for immunization: Secondary | ICD-10-CM | POA: Diagnosis not present

## 2019-09-02 DIAGNOSIS — Z23 Encounter for immunization: Secondary | ICD-10-CM | POA: Diagnosis not present

## 2019-12-20 DIAGNOSIS — H353132 Nonexudative age-related macular degeneration, bilateral, intermediate dry stage: Secondary | ICD-10-CM | POA: Diagnosis not present

## 2020-01-17 ENCOUNTER — Ambulatory Visit (INDEPENDENT_AMBULATORY_CARE_PROVIDER_SITE_OTHER): Payer: Medicare Other

## 2020-01-17 DIAGNOSIS — Z0000 Encounter for general adult medical examination without abnormal findings: Secondary | ICD-10-CM | POA: Diagnosis not present

## 2020-01-17 NOTE — Progress Notes (Signed)
MEDICARE ANNUAL WELLNESS VISIT  01/17/2020  Telephone Visit Disclaimer This Medicare AWV was conducted by telephone due to national recommendations for restrictions regarding the COVID-19 Pandemic (e.g. social distancing).  I verified, using two identifiers, that I am speaking with Jeffery Suarez or their authorized healthcare agent. I discussed the limitations, risks, security, and privacy concerns of performing an evaluation and management service by telephone and the potential availability of an in-person appointment in the future. The patient expressed understanding and agreed to proceed.   Subjective:  Jeffery MinsWeldon Nies is a 84 y.o. male patient of Hawks, Edilia Bohristy A, FNP who had a Medicare Annual Wellness Visit today via telephone. Harrell GaveWeldon lives in nearby [place]. He was located at his home during this telephone visit and I was located here in the [facility]. Harrell GaveWeldon is retired from [employer]. He has one child.   Patient Care Team: Junie SpencerHawks, Christy A, FNP as PCP - General (Family Medicine)  Advanced Directives 01/17/2020 12/23/2018 08/03/2018 07/28/2018  Does Patient Have a Medical Advance Directive? Yes Yes Yes Yes  Type of Advance Directive Living will Healthcare Power of [place];Living will Living will;Healthcare Power of Attorney Living will;Healthcare Power of Attorney  Does patient want to make changes to medical advance directive? No - Patient declined No - Patient declined No - Patient declined No - Patient declined  Copy of Healthcare Power of Attorney in Chart? - No - copy requested No - copy requested No - copy requested    Hospital Utilization Over the Past 12 Months: # of hospitalizations or ER visits: 0 # of surgeries: 0  Review of Systems    Patient reports that his overall health is unchanged compared to last year.  History obtained from chart review  Patient Reported Readings (BP, Pulse, CBG, Weight, etc) none  Pain Assessment Pain :  0-10 Pain Score: 5  Pain Type: Chronic pain Pain Location: Hand Pain Descriptors / Indicators: Aching Pain Onset: More than a month ago Pain Frequency: Intermittent     Current Medications & Allergies (verified) Allergies as of 01/17/2020      Reactions   Shrimp [shellfish Allergy] Nausea And Vomiting      Medication List       Accurate as of January 17, 2020 10:54 AM. If you have any questions, ask your nurse or doctor.        STOP taking these medications   hydrochlorothiazide 12.5 MG capsule Commonly known as: MICROZIDE     TAKE these medications   irbesartan 300 MG tablet Commonly known as: AVAPRO Take 1 tablet (300 mg total) by mouth daily.   rosuvastatin 10 MG tablet Commonly known as: Crestor Take 1 tablet (10 mg total) by mouth daily.       History (reviewed): Past Medical History:  Diagnosis Date  . Arthritis   . Carotid artery occlusion   . GERD (gastroesophageal reflux disease)   . Hx-TIA (transient ischemic attack)   . Hyperlipidemia   . Hypertension   . PVD (peripheral vascular disease) (HCC)    OCCLUDED LEFT INTERNAL CAROTID ARTERY   Past Surgical History:  Procedure Laterality Date  . APPENDECTOMY    . CARDIOVASCULAR STRESS TEST  06/07/2006   EF 63%  . EYE SURGERY     bilateral cataract surgery with lens implants  . TOTAL HIP ARTHROPLASTY Left 08/03/2018   Procedure: TOTAL HIP ARTHROPLASTY ANTERIOR APPROACH;  Surgeon: Ollen GrossAluisio, Frank, MD;  Location: [facility];  Service: Orthopedics;  Laterality:  Left;   . US ECHOCARDIOGRAPHY  02/18/2006   EF 55-60%   Family History  Problem Relation Age of Onset  . Heart attack Brother   . Cancer Sister   . Hypertension Mother    Social History   Socioeconomic History  . Marital status: Significant Other    Spouse name: Not on file  . Number of children: 1  . Years of education: Not on file  . Highest education level: Some college, no degree  Occupational History  . Occupation: Retired    Tobacco Use  . Smoking status: Former Smoker    Quit date: 05/19/1983    Years since quitting: 36.6  . Smokeless tobacco: Never Used  Vaping Use  . Vaping Use: Never used  Substance and Sexual Activity  . Alcohol use: No  . Drug use: No  . Sexual activity: Not Currently  Other Topics Concern  . Not on file  Social History Narrative  . Not on file   Social Determinants of Health   Financial Resource Strain:   . Difficulty of Paying Living Expenses: Not on file  Food Insecurity:   . Worried About Programme researcher, broadcasting/film/video in the Last Year: Not on file  . Ran Out of Food in the Last Year: Not on file  Transportation Needs:   . Lack of Transportation (Medical): Not on file  . Lack of Transportation (Non-Medical): Not on file  Physical Activity:   . Days of Exercise per Week: Not on file  . Minutes of Exercise per Session: Not on file  Stress:   . Feeling of Stress : Not on file  Social Connections:   . Frequency of Communication with Friends and Family: Not on file  . Frequency of Social Gatherings with Friends and Family: Not on file  . Attends Religious Services: Not on file  . Active Member of Clubs or Organizations: Not on file  . Attends Banker Meetings: Not on file  . Marital Status: Not on file    Activities of Daily Living In your present state of health, do you have any difficulty performing the following activities: 01/17/2020  Hearing? N  Vision? Y  Comment Wears for driving and watching TV  Difficulty concentrating or making decisions? N  Walking or climbing stairs? N  Dressing or bathing? N  Doing errands, shopping? N  Preparing Food and eating ? N  Using the Toilet? N  In the past six months, have you accidently leaked urine? N  Do you have problems with loss of bowel control? N  Managing your Medications? N  Managing your Finances? N  Housekeeping or managing your Housekeeping? N  Some recent data might be hidden    Patient Education/  Literacy How often do you need to have someone help you when you read instructions, pamphlets, or other written materials from your doctor or pharmacy?: 1 - Never What is the last grade level you completed in school?: 9th grade and four years of college  Exercise Current Exercise Habits: The patient does not participate in regular exercise at present, Exercise limited by: orthopedic condition(s)  Diet Patient reports consuming 3 meals a day and 2 snack(s) a day Patient reports that his primary diet is: Regular Patient reports that she does have regular access to food.   Depression Screen PHQ 2/9 Scores 01/17/2020 12/23/2018 12/13/2018 05/05/2018 03/25/2018 03/18/2018  PHQ - 2 Score 0 0 0 0 0 0     Fall Risk Fall Risk  01/17/2020 12/23/2018 12/13/2018 05/05/2018 03/25/2018  Falls in the past year? 1 0 0 0 0  Number falls in past yr: 0 0 - - -  Injury with Fall? 0 0 - - -     Objective:  Jeffery Mins seemed alert and oriented and he participated appropriately during our telephone visit.  Blood Pressure Weight BMI  BP Readings from Last 3 Encounters:  12/13/18 (!) 147/71  08/04/18 (!) 163/74  07/29/18 132/80   Wt Readings from Last 3 Encounters:  12/23/18 231 lb 14.8 oz (105.2 kg)  12/13/18 232 lb (105.2 kg)  08/03/18 247 lb 9.6 oz (112.3 kg)   BMI Readings from Last 1 Encounters:  12/23/18 33.28 kg/m    *Unable to obtain current vital signs, weight, and BMI due to telephone visit type  Hearing/Vision  . Haskel did not seem to have difficulty with hearing/understanding during the telephone conversation . Reports that he has had a formal eye exam by an eye care professional within the past year . Reports that he has not had a formal hearing evaluation within the past year *Unable to fully assess hearing and vision during telephone visit type  Cognitive Function: 6CIT Screen 01/17/2020 12/23/2018  What Year? 0 points 0 points  What month? 0 points 0 points  What time? 0 points 0  points  Count back from 20 0 points 0 points  Months in reverse 0 points 0 points  Repeat phrase 0 points 0 points  Total Score 0 0   (Normal:0-7, Significant for Dysfunction: >8)  Normal Cognitive Function Screening: Yes   Immunization & Health Maintenance Record Immunization History  Administered Date(s) Administered  . Influenza, High Dose Seasonal PF 07/02/2015, 02/10/2017, 03/18/2018  . Influenza-Unspecified 04/26/2014  . PFIZER SARS-COV-2 Vaccination 08/12/2019, 09/02/2019  . Pneumococcal Conjugate-13 08/12/2016  . Pneumococcal Polysaccharide-23 12/13/2018    Health Maintenance  Topic Date Due  . TETANUS/TDAP  Never done  . INFLUENZA VACCINE  12/17/2019  . COVID-19 Vaccine  Completed  . PNA vac Low Risk Adult  Completed       Assessment  This is a routine wellness examination for Jeffery Mins.  Health Maintenance: Due or Overdue Health Maintenance Due  Topic Date Due  . TETANUS/TDAP  Never done  . INFLUENZA VACCINE  12/17/2019    Jeffery Mins does not need a referral for Community Assistance: Care Management:   no Social Work:    no Prescription Assistance:  no Nutrition/Diabetes Education:  no   Plan:  Personalized Goals Goals Addressed            This Visit's Progress   . DIET - EAT MORE FRUITS AND VEGETABLES        Personalized Health Maintenance & Screening Recommendations  Influenza vaccine  Lung Cancer Screening Recommended: no (Low Dose CT Chest recommended if Age 78-80 years, 30 pack-year currently smoking OR have quit w/in past 15 years) Hepatitis C Screening recommended: no HIV Screening recommended: no  Advanced Directives: Written information was not prepared per patient's request.  Referrals & Orders No orders of the defined types were placed in this encounter.   Follow-up Plan . Follow-up with Junie Spencer, FNP as planned . Schedule for flu vaccine    I have personally reviewed and noted the following in the  patient's chart:   . Medical and social history . Use of alcohol, tobacco or illicit drugs  . Current medications and supplements . Functional ability and status . Nutritional status . Physical activity .  Advanced directives . List of other physicians . Hospitalizations, surgeries, and ER visits in previous 12 months . Vitals . Screenings to include cognitive, depression, and falls . Referrals and appointments  In addition, I have reviewed and discussed with Jeffery Mins certain preventive protocols, quality metrics, and best practice recommendations. A written personalized care plan for preventive services as well as general preventive health recommendations is available and can be mailed to the patient at his request.      Cleda Daub LPN 09/18/2990

## 2020-01-17 NOTE — Patient Instructions (Signed)
  Mr. Sweetman , Thank you for taking time to come for your Medicare Wellness Visit. I appreciate your ongoing commitment to your health goals. Please review the following plan we discussed and let me know if I can assist you in the future.   These are the goals we discussed: Goals    . DIET - EAT MORE FRUITS AND VEGETABLES    . Exercise 3x per week (30 min per time)     Try to exercise for at least 30 minutes 3 times weekly       This is a list of the screening recommended for you and due dates:  Health Maintenance  Topic Date Due  . Tetanus Vaccine  Never done  . Flu Shot  12/17/2019  . COVID-19 Vaccine  Completed  . Pneumonia vaccines  Completed

## 2020-01-25 IMAGING — DX DG HIP (WITH OR WITHOUT PELVIS) 2-3V*L*
3 series · 3 of 3 positions shown · non-contrast
Comparison: None.

CLINICAL DATA: Pain

EXAM:
DG HIP (WITH OR WITHOUT PELVIS) 2-3V LEFT

[pelvis ap]
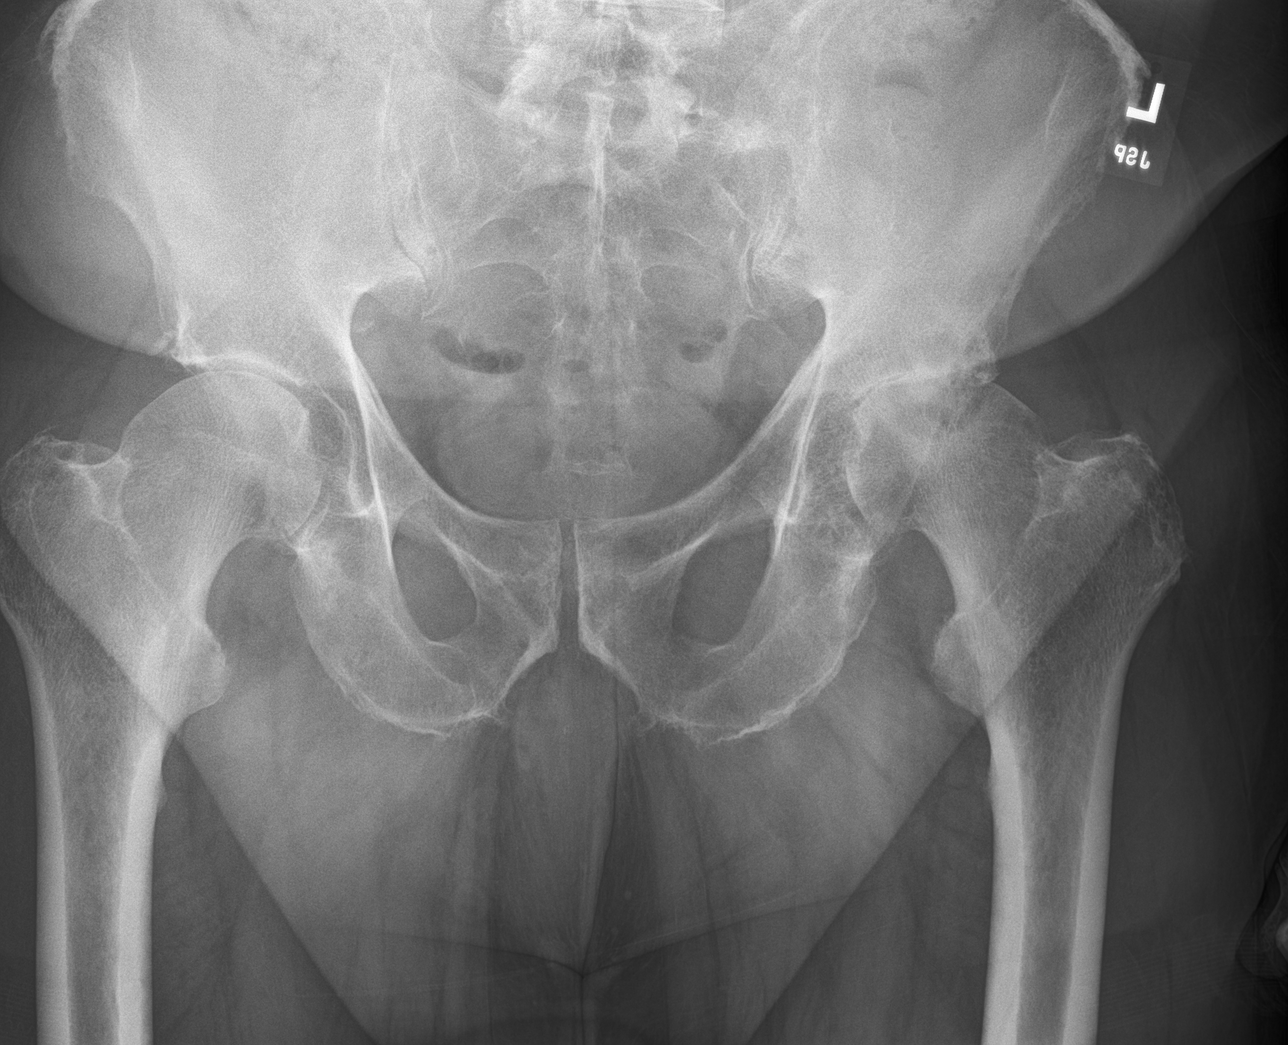

[hip ap]
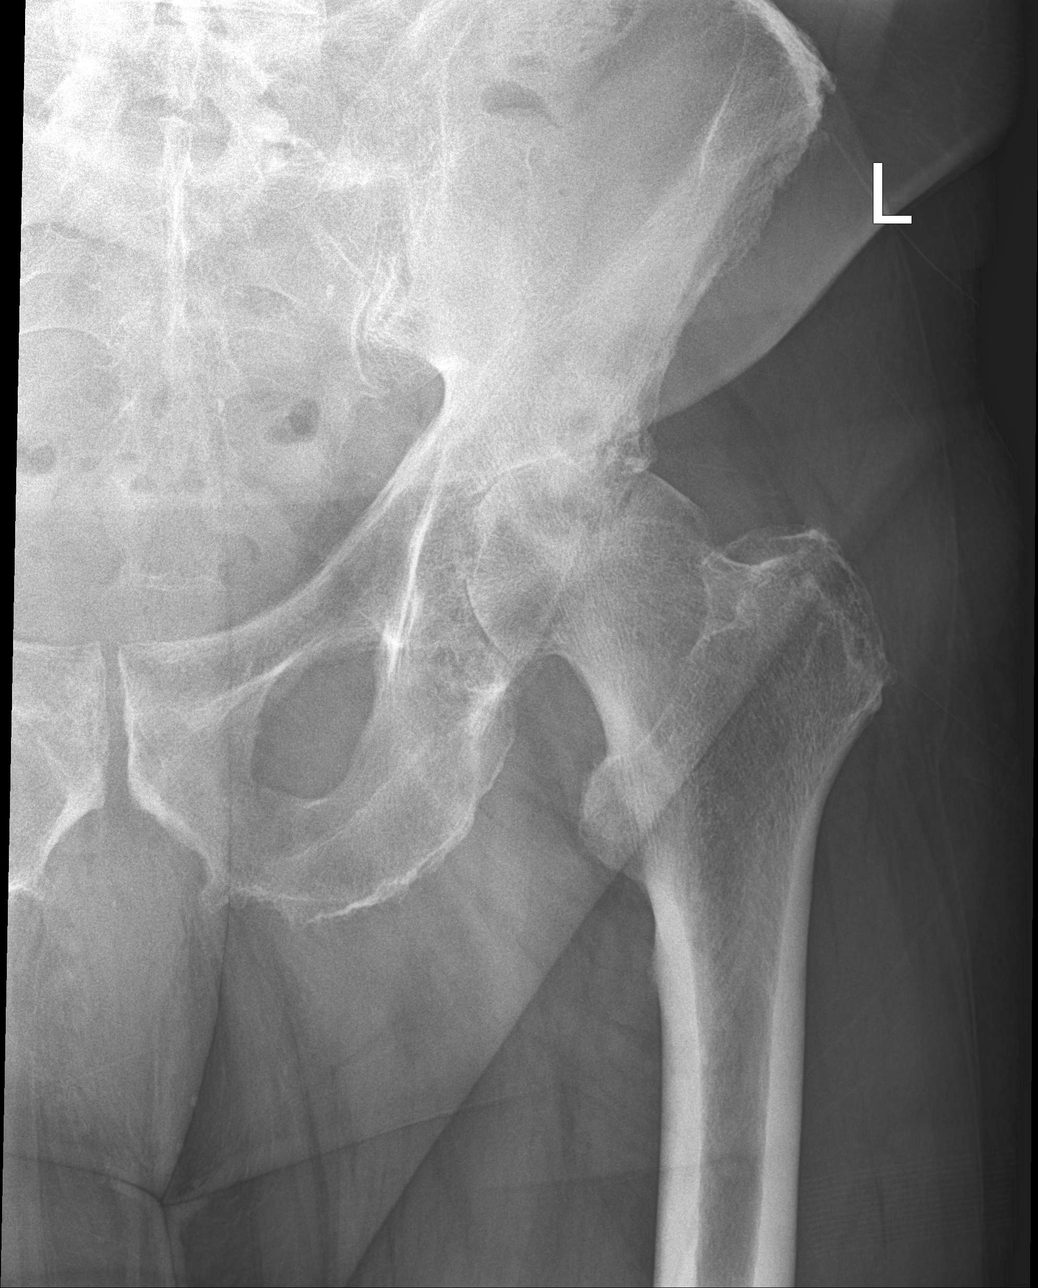

[hip lat]
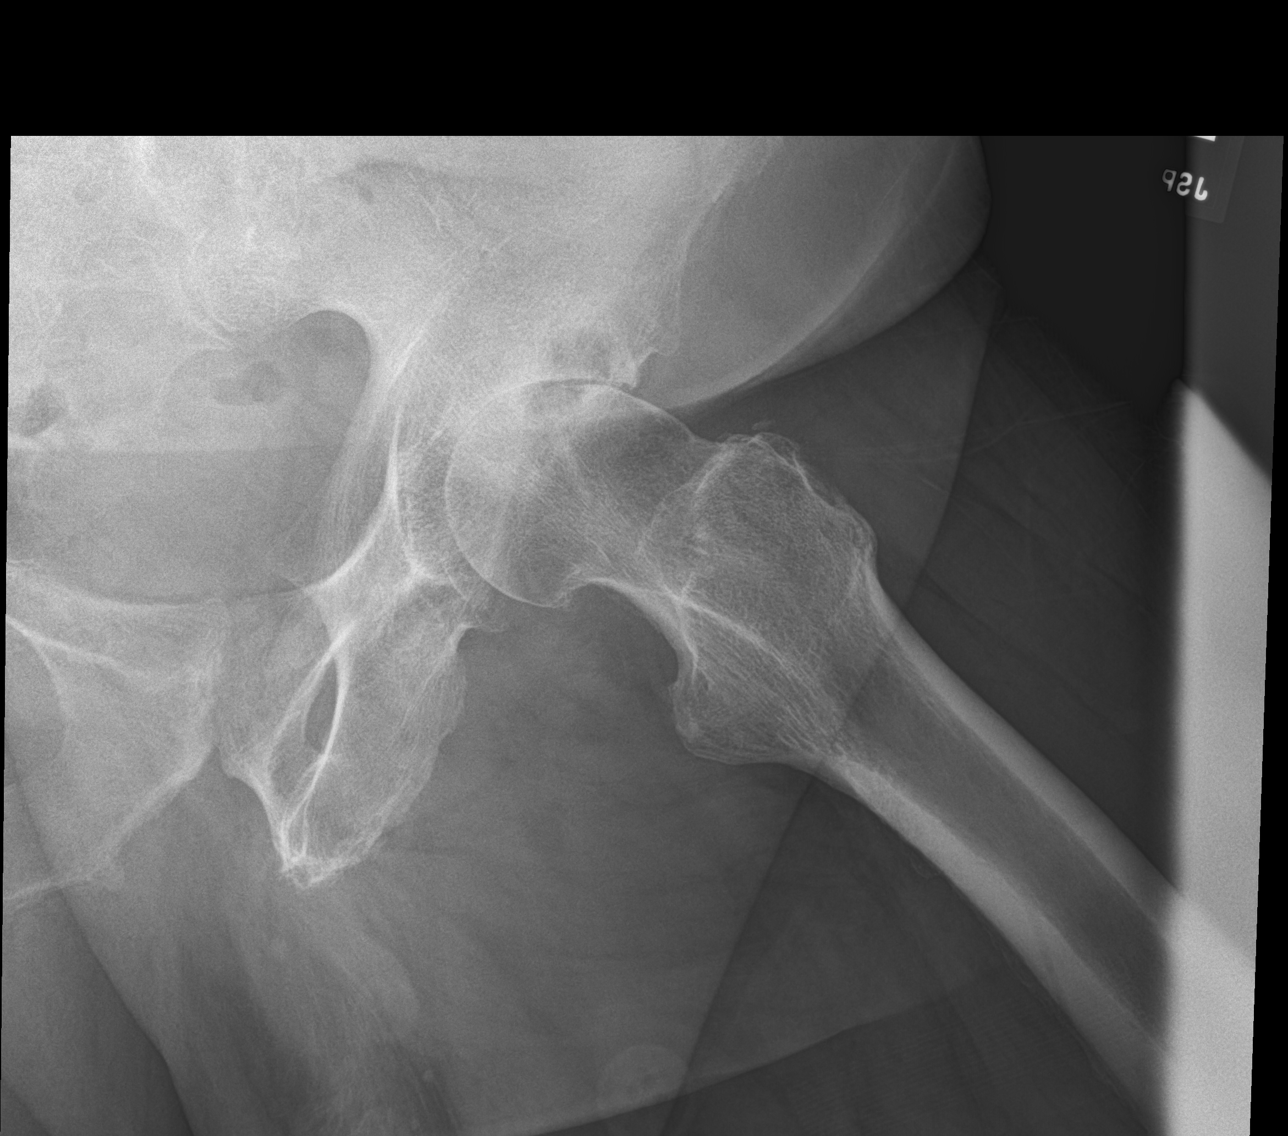

[3 of 3 positions shown; findings below may reference images not displayed]

FINDINGS: Frontal pelvis as well as frontal and lateral left hip images were
obtained. No acute fracture or dislocation evident. There is
moderately severe narrowing of each hip joint, more severe on the
left than on the right. There is avascular necrosis in the left
femoral head. There is also an osteochondral lesion along the
lateral left acetabulum. Osteoarthritic changes noted in the right
sacroiliac joint.
IMPRESSION: Advanced arthropathy in the left hip joint with osteochondral
lesions in the lateral left acetabulum and lateral femoral head.
There is felt to be avascular necrosis in the left femoral head.
There is moderately severe narrowing of the right hip joint. There
is also osteoarthritic change in the right sacroiliac joint. No
acute fracture or dislocation evident.

MR would be the imaging study of choice to quantify a degree of
abnormality in the left hip joint region.

## 2020-03-31 IMAGING — DX DG CHEST 2V
2 series · 2 of 2 positions shown · non-contrast
Comparison: None.

CLINICAL DATA: Preop evaluation.

EXAM:
CHEST - 2 VIEW

[chest pa]
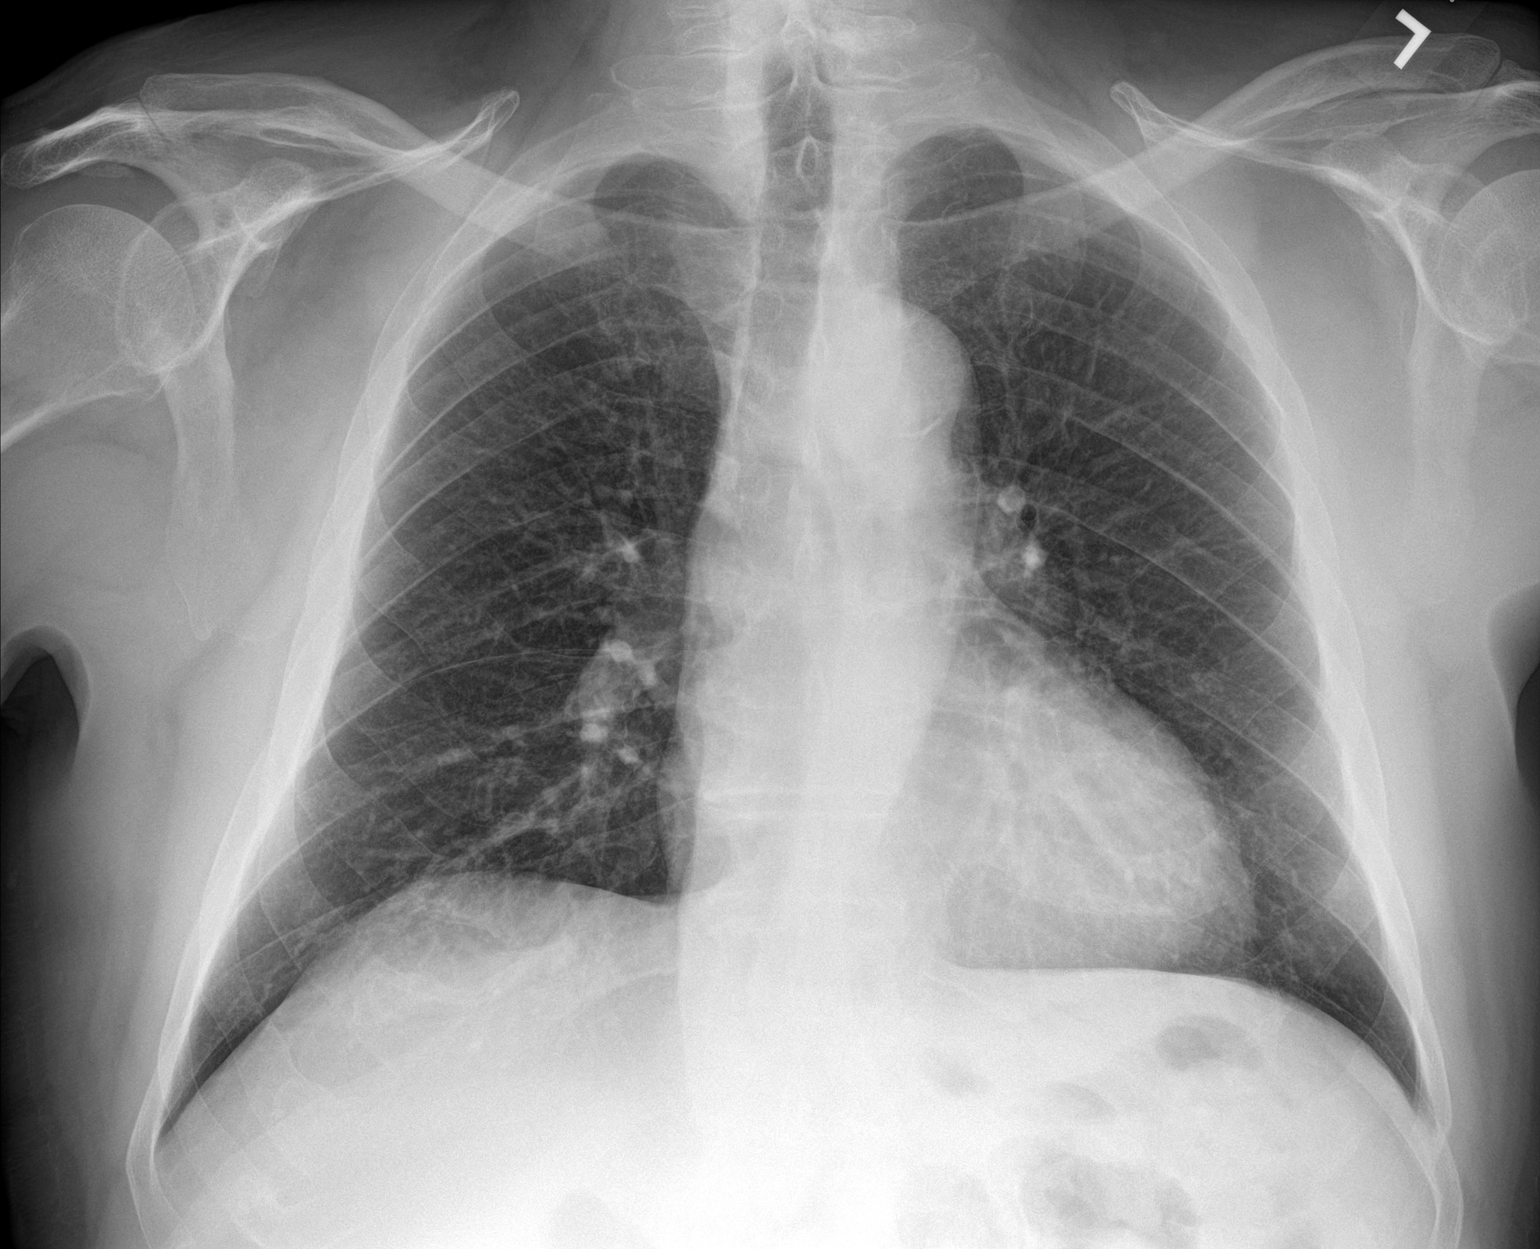

[chest lat]
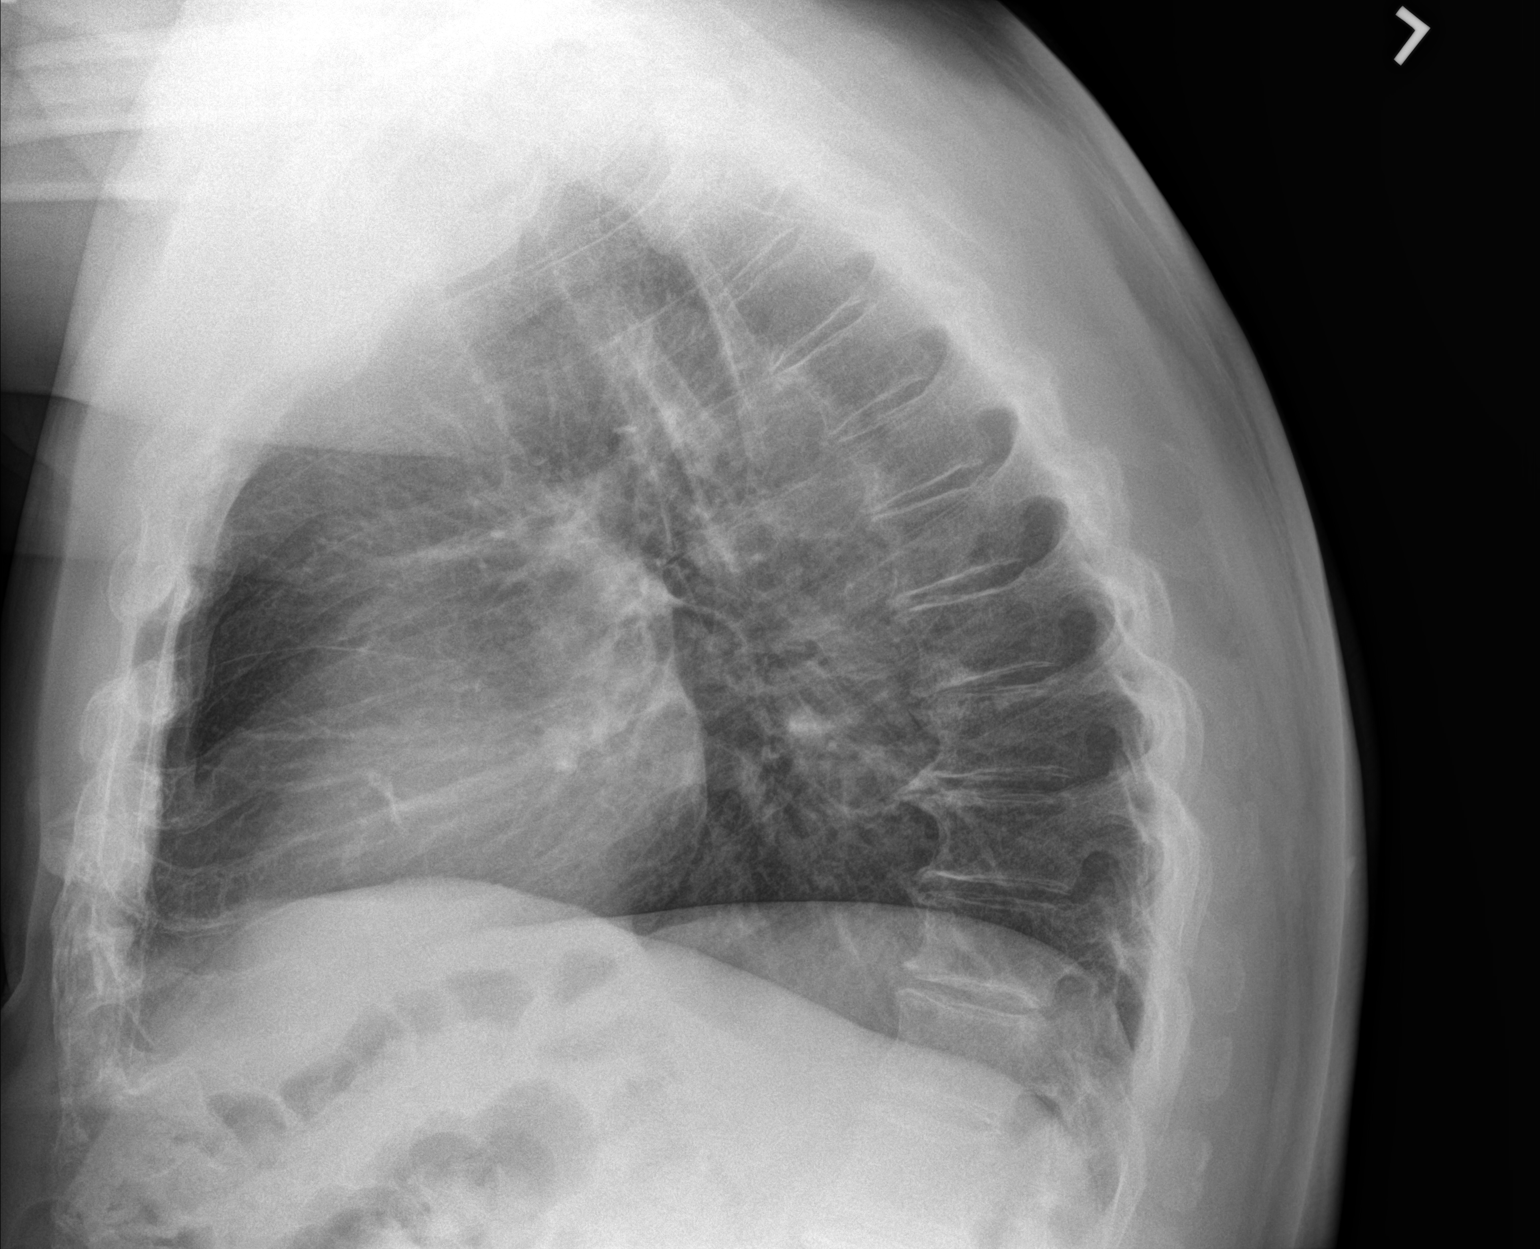

[2 of 2 positions shown; findings below may reference images not displayed]

FINDINGS: The heart size and mediastinal contours are within normal limits.
Both lungs are clear. The visualized skeletal structures are
unremarkable.
IMPRESSION: No active cardiopulmonary disease.

## 2020-04-17 ENCOUNTER — Other Ambulatory Visit: Payer: Self-pay | Admitting: Family

## 2020-04-17 DIAGNOSIS — E785 Hyperlipidemia, unspecified: Secondary | ICD-10-CM

## 2020-04-17 DIAGNOSIS — I739 Peripheral vascular disease, unspecified: Secondary | ICD-10-CM

## 2020-04-17 DIAGNOSIS — I10 Essential (primary) hypertension: Secondary | ICD-10-CM

## 2020-05-03 ENCOUNTER — Telehealth: Payer: Self-pay

## 2020-05-03 DIAGNOSIS — E785 Hyperlipidemia, unspecified: Secondary | ICD-10-CM

## 2020-05-03 DIAGNOSIS — I739 Peripheral vascular disease, unspecified: Secondary | ICD-10-CM

## 2020-05-03 MED ORDER — ROSUVASTATIN CALCIUM 10 MG PO TABS: 10.0000 mg | ORAL_TABLET | Freq: Every day | ORAL | 3 refills | Status: DC

## 2020-05-03 NOTE — Telephone Encounter (Signed)
  Prescription Request  05/03/2020  What is the name of the medication or equipment? Crestor (Generic)  Have you contacted your pharmacy to request a refill? (if applicable)yes  Which pharmacy would you like this sent to? [facility]   Patient notified that their request is being sent to the clinical staff for review and that they should receive a response within 2 business days.   Jeffery Suarez' pt.  Please call pt.  He called about a month ago & he never got a call back.  I told pt that he may need an appt.

## 2020-05-03 NOTE — Telephone Encounter (Signed)
Refill sent.

## 2020-05-06 ENCOUNTER — Other Ambulatory Visit: Payer: Self-pay

## 2020-05-06 DIAGNOSIS — E785 Hyperlipidemia, unspecified: Secondary | ICD-10-CM

## 2020-05-06 DIAGNOSIS — I10 Essential (primary) hypertension: Secondary | ICD-10-CM

## 2020-05-06 DIAGNOSIS — I739 Peripheral vascular disease, unspecified: Secondary | ICD-10-CM

## 2020-05-07 MED ORDER — IRBESARTAN 300 MG PO TABS: 300.0000 mg | ORAL_TABLET | Freq: Every day | ORAL | 1 refills | Status: DC

## 2020-05-07 MED ORDER — ROSUVASTATIN CALCIUM 10 MG PO TABS: 10.0000 mg | ORAL_TABLET | Freq: Every day | ORAL | 1 refills | Status: DC

## 2020-06-04 ENCOUNTER — Ambulatory Visit: Payer: Medicare Other | Admitting: Family

## 2020-06-11 ENCOUNTER — Ambulatory Visit (INDEPENDENT_AMBULATORY_CARE_PROVIDER_SITE_OTHER): Payer: Medicare Other | Admitting: Family

## 2020-06-11 ENCOUNTER — Other Ambulatory Visit: Payer: Self-pay

## 2020-06-11 ENCOUNTER — Encounter: Payer: Self-pay | Admitting: Family

## 2020-06-11 VITALS — BP 162/65 | HR 84 | Temp 98.1°F | Ht 70.0 in | Wt 237.0 lb

## 2020-06-11 DIAGNOSIS — I10 Essential (primary) hypertension: Secondary | ICD-10-CM

## 2020-06-11 DIAGNOSIS — M159 Polyosteoarthritis, unspecified: Secondary | ICD-10-CM

## 2020-06-11 DIAGNOSIS — I739 Peripheral vascular disease, unspecified: Secondary | ICD-10-CM | POA: Diagnosis not present

## 2020-06-11 DIAGNOSIS — E785 Hyperlipidemia, unspecified: Secondary | ICD-10-CM | POA: Diagnosis not present

## 2020-06-11 DIAGNOSIS — M8949 Other hypertrophic osteoarthropathy, multiple sites: Secondary | ICD-10-CM

## 2020-06-11 LAB — CMP14+EGFR: BUN/Creatinine Ratio: 26 — ABNORMAL HIGH (ref 10–24)

## 2020-06-11 MED ORDER — ROSUVASTATIN CALCIUM 10 MG PO TABS: 10.0000 mg | ORAL_TABLET | Freq: Every day | ORAL | 1 refills | Status: DC

## 2020-06-11 MED ORDER — IRBESARTAN 300 MG PO TABS: 300.0000 mg | ORAL_TABLET | Freq: Every day | ORAL | 2 refills | Status: DC

## 2020-06-11 MED ORDER — DICLOFENAC SODIUM 75 MG PO TBEC: 75.0000 mg | DELAYED_RELEASE_TABLET | Freq: Two times a day (BID) | ORAL | 2 refills | Status: DC

## 2020-06-11 NOTE — Patient Instructions (Signed)
Osteoarthritis  Osteoarthritis is a type of arthritis. It refers to joint pain or joint disease. Osteoarthritis affects tissue that covers the ends of bones in joints (cartilage). Cartilage acts as a cushion between the bones and helps them move smoothly. Osteoarthritis occurs when cartilage in the joints gets worn down. Osteoarthritis is sometimes called "wear and tear" arthritis. Osteoarthritis is the most common form of arthritis. It often occurs in older people. It is a condition that gets worse over time. The joints most often affected by this condition are in the fingers, toes, hips, knees, and spine, including the neck and lower back. What are the causes? This condition is caused by the wearing down of cartilage that covers the ends of bones. What increases the risk? The following factors may make you more likely to develop this condition:  Being age 50 or older.  Obesity.  Overuse of joints.  Past injury of a joint.  Past surgery on a joint.  Family history of osteoarthritis. What are the signs or symptoms? The main symptoms of this condition are pain, swelling, and stiffness in the joint. Other symptoms may include:  An enlarged joint.  More pain and further damage caused by small pieces of bone or cartilage that break off and float inside of the joint.  Small deposits of bone (osteophytes) that grow on the edges of the joint.  A grating or scraping feeling inside the joint when you move it.  Popping or creaking sounds when you move.  Difficulty walking or exercising.  An inability to grip items, twist your hand(s), or control the movements of your hands and fingers. How is this diagnosed? This condition may be diagnosed based on:  Your medical history.  A physical exam.  Your symptoms.  X-rays of the affected joint(s).  Blood tests to rule out other types of arthritis. How is this treated? There is no cure for this condition, but treatment can help control  pain and improve joint function. Treatment may include a combination of therapies, such as:  Pain relief techniques, such as: ? Applying heat and cold to the joint. ? Massage. ? A form of talk therapy called cognitive behavioral therapy (CBT). This therapy helps you set goals and follow up on the changes that you make.  Medicines for pain and inflammation. The medicines can be taken by mouth or applied to the skin. They include: ? NSAIDs, such as ibuprofen. ? Prescription medicines. ? Strong anti-inflammatory medicines (corticosteroids). ? Certain nutritional supplements.  A prescribed exercise program. You may work with a physical therapist.  Assistive devices, such as a brace, wrap, splint, specialized glove, or cane.  A weight control plan.  Surgery, such as: ? An osteotomy. This is done to reposition the bones and relieve pain or to remove loose pieces of bone and cartilage. ? Joint replacement surgery. You may need this surgery if you have advanced osteoarthritis. Follow these instructions at home: Activity  Rest your affected joints as told by your health care provider.  Exercise as told by your health care provider. He or she may recommend specific types of exercise, such as: ? Strengthening exercises. These are done to strengthen the muscles that support joints affected by arthritis. ? Aerobic activities. These are exercises, such as brisk walking or water aerobics, that increase your heart rate. ? Range-of-motion activities. These help your joints move more easily. ? Balance and agility exercises. Managing pain, stiffness, and swelling  If directed, apply heat to the affected area as often   as told by your health care provider. Use the heat source that your health care provider recommends, such as a moist heat pack or a heating pad. ? If you have a removable assistive device, remove it as told by your health care provider. ? Place a towel between your skin and the heat  source. If your health care provider tells you to keep the assistive device on while you apply heat, place a towel between the assistive device and the heat source. ? Leave the heat on for 20-30 minutes. ? Remove the heat if your skin turns bright red. This is especially important if you are unable to feel pain, heat, or cold. You may have a greater risk of getting burned.  If directed, put ice on the affected area. To do this: ? If you have a removable assistive device, remove it as told by your health care provider. ? Put ice in a plastic bag. ? Place a towel between your skin and the bag. If your health care provider tells you to keep the assistive device on during icing, place a towel between the assistive device and the bag. ? Leave the ice on for 20 minutes, 2-3 times a day. ? Move your fingers or toes often to reduce stiffness and swelling. ? Raise (elevate) the injured area above the level of your heart while you are sitting or lying down.      General instructions  Take over-the-counter and prescription medicines only as told by your health care provider.  Maintain a healthy weight. Follow instructions from your health care provider for weight control.  Do not use any products that contain nicotine or tobacco, such as cigarettes, e-cigarettes, and chewing tobacco. If you need help quitting, ask your health care provider.  Use assistive devices as told by your health care provider.  Keep all follow-up visits as told by your health care provider. This is important. Where to find more information  National Institute of Arthritis and Musculoskeletal and Skin Diseases: www.niams.nih.gov  National Institute on Aging: www.nia.nih.gov  American College of Rheumatology: www.rheumatology.org Contact a health care provider if:  You have redness, swelling, or a feeling of warmth in a joint that gets worse.  You have a fever along with joint or muscle aches.  You develop a  rash.  You have trouble doing your normal activities. Get help right away if:  You have pain that gets worse and is not relieved by pain medicine. Summary  Osteoarthritis is a type of arthritis that affects tissue covering the ends of bones in joints (cartilage).  This condition is caused by the wearing down of cartilage that covers the ends of bones.  The main symptom of this condition is pain, swelling, and stiffness in the joint.  There is no cure for this condition, but treatment can help control pain and improve joint function. This information is not intended to replace advice given to you by your health care provider. Make sure you discuss any questions you have with your health care provider. Document Revised: 05/01/2019 Document Reviewed: 05/01/2019 Elsevier Patient Education  2021 [employer]  

## 2020-06-11 NOTE — Progress Notes (Signed)
Subjective:    Patient ID: Jeffery Suarez, male    DOB: 26-Sep-1935, 85 y.o.   MRN: [id number]  Chief Complaint  Patient presents with  . Medical Management of Chronic Issues   Pt presents to the office today for chronic follow up.  Hypertension This is a chronic problem. The current episode started more than 1 year ago. The problem has been waxing and waning since onset. The problem is uncontrolled. Pertinent negatives include no malaise/fatigue, peripheral edema or shortness of breath. Risk factors for coronary artery disease include dyslipidemia, obesity and male gender. The current treatment provides moderate improvement.  Arthritis Presents for follow-up visit. He complains of pain and stiffness. The symptoms have been stable. Affected locations include the left hip, right hip, left MCP and right MCP. His pain is at a severity of 10/10.  Hyperlipidemia This is a chronic problem. The current episode started more than 1 year ago. The problem is controlled. Recent lipid tests were reviewed and are normal. Exacerbating diseases include obesity. Pertinent negatives include no shortness of breath. Current antihyperlipidemic treatment includes statins. The current treatment provides moderate improvement of lipids. Risk factors for coronary artery disease include dyslipidemia, male sex, hypertension and a sedentary lifestyle.      Review of Systems  Constitutional: Negative for malaise/fatigue.  Respiratory: Negative for shortness of breath.   Musculoskeletal: Positive for arthritis and stiffness.  All other systems reviewed and are negative.      Objective:   Physical Exam Vitals reviewed.  Constitutional:      General: He is not in acute distress.    Appearance: He is well-developed and well-nourished.  HENT:     Head: Normocephalic.     Right Ear: Tympanic membrane normal.     Left Ear: Tympanic membrane normal.     Mouth/Throat:     Mouth: Oropharynx is clear and moist.  Eyes:      General:        Right eye: No discharge.        Left eye: No discharge.     Pupils: Pupils are equal, round, and reactive to light.  Neck:     Thyroid: No thyromegaly.  Cardiovascular:     Rate and Rhythm: Normal rate and regular rhythm.     Pulses: Intact distal pulses.     Heart sounds: Normal heart sounds. No murmur heard.   Pulmonary:     Effort: Pulmonary effort is normal. No respiratory distress.     Breath sounds: Normal breath sounds. No wheezing.  Abdominal:     General: Bowel sounds are normal. There is no distension.     Palpations: Abdomen is soft.     Tenderness: There is no abdominal tenderness.  Musculoskeletal:        General: No tenderness or edema. Normal range of motion.     Cervical back: Normal range of motion and neck supple.  Skin:    General: Skin is warm and dry.     Findings: No erythema or rash.  Neurological:     Mental Status: He is alert and oriented to person, place, and time.     Cranial Nerves: No cranial nerve deficit.     Deep Tendon Reflexes: Reflexes are normal and symmetric.  Psychiatric:        Mood and Affect: Mood and affect normal.        Behavior: Behavior normal.        Thought Content: Thought content normal.  Judgment: Judgment normal.          BP (!) 162/65   Pulse 84   Temp 98.1 F (36.7 C) (Temporal)   Ht '5\' 10"'  (1.778 m)   Wt 237 lb (107.5 kg)   BMI 34.01 kg/m   Assessment & Plan:  Jeffery Suarez comes in today with chief complaint of Medical Management of Chronic Issues   Diagnosis and orders addressed:  1. Essential hypertension - irbesartan (AVAPRO) 300 MG tablet; Take 1 tablet (300 mg total) by mouth daily.  Dispense: 90 tablet; Refill: 2 - CMP14+EGFR - CBC with Differential/Platelet  2. Peripheral vascular disease (HCC) - rosuvastatin (CRESTOR) 10 MG tablet; Take 1 tablet (10 mg total) by mouth daily.  Dispense: 90 tablet; Refill: 1 - CMP14+EGFR - CBC with Differential/Platelet  3.  Hyperlipidemia, unspecified hyperlipidemia type - rosuvastatin (CRESTOR) 10 MG tablet; Take 1 tablet (10 mg total) by mouth daily.  Dispense: 90 tablet; Refill: 1 - CMP14+EGFR - CBC with Differential/Platelet - Lipid panel  4. Primary osteoarthritis involving multiple joints - CMP14+EGFR - CBC with Differential/Platelet - diclofenac (VOLTAREN) 75 MG EC tablet; Take 1 tablet (75 mg total) by mouth 2 (two) times daily.  Dispense: 180 tablet; Refill: 2   Labs pending Health Maintenance reviewed- Will check at [facility] for TDAP Diet and exercise encouraged  Follow up plan: 6 months    Evelina Dun, FNP

## 2020-06-12 LAB — CMP14+EGFR
ALT: 25 IU/L (ref 0–44)
AST: 22 IU/L (ref 0–40)
Albumin/Globulin Ratio: 1.7 (ref 1.2–2.2)
Albumin: 4.3 g/dL (ref 3.6–4.6)
Alkaline Phosphatase: 121 IU/L (ref 44–121)
BUN: 23 mg/dL (ref 8–27)
Bilirubin Total: 1 mg/dL (ref 0.0–1.2)
CO2: 22 mmol/L (ref 20–29)
Calcium: 9.7 mg/dL (ref 8.6–10.2)
Chloride: 104 mmol/L (ref 96–106)
Creatinine, Ser: 0.89 mg/dL (ref 0.76–1.27)
GFR calc Af Amer: 91 mL/min/{1.73_m2} (ref >59)
GFR calc non Af Amer: 79 mL/min/{1.73_m2} (ref >59)
Globulin, Total: 2.6 g/dL (ref 1.5–4.5)
Glucose: 109 mg/dL — ABNORMAL HIGH (ref 65–99)
Potassium: 4.5 mmol/L (ref 3.5–5.2)
Sodium: 141 mmol/L (ref 134–144)
Total Protein: 6.9 g/dL (ref 6.0–8.5)

## 2020-06-12 LAB — CBC WITH DIFFERENTIAL/PLATELET
Basophils Absolute: 0 10*3/uL (ref 0.0–0.2)
Basos: 1 %
EOS (ABSOLUTE): 0.1 10*3/uL (ref 0.0–0.4)
Eos: 3 %
Hematocrit: 39.6 % (ref 37.5–51.0)
Hemoglobin: 13.5 g/dL (ref 13.0–17.7)
Immature Grans (Abs): 0 10*3/uL (ref 0.0–0.1)
Immature Granulocytes: 0 %
Lymphocytes Absolute: 1.5 10*3/uL (ref 0.7–3.1)
Lymphs: 33 %
MCH: 28.8 pg (ref 26.6–33.0)
MCHC: 34.1 g/dL (ref 31.5–35.7)
MCV: 85 fL (ref 79–97)
Monocytes Absolute: 0.6 10*3/uL (ref 0.1–0.9)
Monocytes: 14 %
Neutrophils Absolute: 2.3 10*3/uL (ref 1.4–7.0)
Neutrophils: 49 %
Platelets: 214 10*3/uL (ref 150–450)
RBC: 4.68 x10E6/uL (ref 4.14–5.80)
RDW: 12.9 % (ref 11.6–15.4)
WBC: 4.6 10*3/uL (ref 3.4–10.8)

## 2020-06-12 LAB — LIPID PANEL
Chol/HDL Ratio: 3.2 ratio (ref 0.0–5.0)
Cholesterol, Total: 103 mg/dL (ref 100–199)
HDL: 32 mg/dL — ABNORMAL LOW (ref >39)
LDL Chol Calc (NIH): 49 mg/dL (ref 0–99)
Triglycerides: 121 mg/dL (ref 0–149)
VLDL Cholesterol Cal: 22 mg/dL (ref 5–40)

## 2021-01-17 ENCOUNTER — Telehealth: Payer: Self-pay

## 2021-01-17 ENCOUNTER — Ambulatory Visit (INDEPENDENT_AMBULATORY_CARE_PROVIDER_SITE_OTHER): Payer: Medicare Other

## 2021-01-17 VITALS — Ht 71.0 in | Wt 237.0 lb

## 2021-01-17 DIAGNOSIS — Z0000 Encounter for general adult medical examination without abnormal findings: Secondary | ICD-10-CM

## 2021-01-17 NOTE — Telephone Encounter (Signed)
Spoke with pt for AWV today. Pt states he is still having issues with arthritis pain in his hands. Pt currently on Diclofenac 75 mg BID. He asks if dose can be increased? Thank you.

## 2021-01-17 NOTE — Telephone Encounter (Signed)
Patient aware and verbalized understanding. °

## 2021-01-17 NOTE — Telephone Encounter (Signed)
Unfortunately, cannot.  If normal LFTs, can add Tylenol arthritis to Diclofenac safely.

## 2021-01-17 NOTE — Patient Instructions (Signed)
[name] , Thank you for taking time to come for your Medicare Wellness Visit. I appreciate your ongoing commitment to your health goals. Please review the following plan we discussed and let me know if I can assist you in the future.   Screening recommendations/referrals: Colonoscopy: No longer required. Recommended yearly ophthalmology/optometry visit for glaucoma screening and checkup Recommended yearly dental visit for hygiene and checkup  Vaccinations: Influenza vaccine: 02/16/20, due yearly Pneumococcal vaccine: 08/12/16, 12/13/18 Tdap vaccine: Repeat in 10 years  Shingles vaccine: Shingrix discussed. Please contact your pharmacy for coverage information.     Covid-19: 03/12/20 09/02/19 08/12/19  Advanced directives: Please bring a copy of your health care power of attorney and living will to the office to be added to your chart at your convenience.   Conditions/risks identified: Aim for 30 minutes of exercise or brisk walking each day, drink 6-8 glasses of water and eat lots of fruits and vegetables.   Next appointment: Follow up in one year for your annual wellness visit.   Preventive Care 96 Years and Older, Male  Preventive care refers to lifestyle choices and visits with your health care provider that can promote health and wellness. What does preventive care include? A yearly physical exam. This is also called an annual well check. Dental exams once or twice a year. Routine eye exams. Ask your health care provider how often you should have your eyes checked. Personal lifestyle choices, including: Daily care of your teeth and gums. Regular physical activity. Eating a healthy diet. Avoiding tobacco and drug use. Limiting alcohol use. Practicing safe sex. Taking low doses of aspirin every day. Taking vitamin and mineral supplements as recommended by your health care provider. What happens during an annual well check? The services and screenings done by your health  care provider during your annual well check will depend on your age, overall health, lifestyle risk factors, and family history of disease. Counseling  Your health care provider may ask you questions about your: Alcohol use. Tobacco use. Drug use. Emotional well-being. Home and relationship well-being. Sexual activity. Eating habits. History of falls. Memory and ability to understand (cognition). Work and work Astronomer. Screening  You may have the following tests or measurements: Height, weight, and BMI. Blood pressure. Lipid and cholesterol levels. These may be checked every 5 years, or more frequently if you are over 83 years old. Skin check. Lung cancer screening. You may have this screening every year starting at age 41 if you have a 30-pack-year history of smoking and currently smoke or have quit within the past 15 years. Fecal occult blood test (FOBT) of the stool. You may have this test every year starting at age 76. Flexible sigmoidoscopy or colonoscopy. You may have a sigmoidoscopy every 5 years or a colonoscopy every 10 years starting at age 46. Prostate cancer screening. Recommendations will vary depending on your family history and other risks. Hepatitis C blood test. Hepatitis B blood test. Sexually transmitted disease (STD) testing. Diabetes screening. This is done by checking your blood sugar (glucose) after you have not eaten for a while (fasting). You may have this done every 1-3 years. Abdominal aortic aneurysm (AAA) screening. You may need this if you are a current or former smoker. Osteoporosis. You may be screened starting at age 62 if you are at high risk. Talk with your health care provider about your test results, treatment options, and if necessary, the need for more tests. Vaccines  Your health care provider may recommend certain  vaccines, such as: Influenza vaccine. This is recommended every year. Tetanus, diphtheria, and acellular pertussis (Tdap, Td)  vaccine. You may need a Td booster every 10 years. Zoster vaccine. You may need this after age 50. Pneumococcal 13-valent conjugate (PCV13) vaccine. One dose is recommended after age 110. Pneumococcal polysaccharide (PPSV23) vaccine. One dose is recommended after age 13. Talk to your health care provider about which screenings and vaccines you need and how often you need them. This information is not intended to replace advice given to you by your health care provider. Make sure you discuss any questions you have with your health care provider. Document Released: 05/31/2015 Document Revised: 01/22/2016 Document Reviewed: 03/05/2015 Elsevier Interactive Patient Education  2017 [employer] Prevention in the Home Falls can cause injuries. They can happen to people of all ages. There are many things you can do to make your home safe and to help prevent falls. What can I do on the outside of my home? Regularly fix the edges of walkways and driveways and fix any cracks. Remove anything that might make you trip as you walk through a door, such as a raised step or threshold. Trim any bushes or trees on the path to your home. Use bright outdoor lighting. Clear any walking paths of anything that might make someone trip, such as rocks or tools. Regularly check to see if handrails are loose or broken. Make sure that both sides of any steps have handrails. Any raised decks and porches should have guardrails on the edges. Have any leaves, snow, or ice cleared regularly. Use sand or salt on walking paths during winter. Clean up any spills in your garage right away. This includes oil or grease spills. What can I do in the bathroom? Use night lights. Install grab bars by the toilet and in the tub and shower. Do not use towel bars as grab bars. Use non-skid mats or decals in the tub or shower. If you need to sit down in the shower, use a plastic, non-slip stool. Keep the floor dry. Clean up any  water that spills on the floor as soon as it happens. Remove soap buildup in the tub or shower regularly. Attach bath mats securely with double-sided non-slip rug tape. Do not have throw rugs and other things on the floor that can make you trip. What can I do in the bedroom? Use night lights. Make sure that you have a light by your bed that is easy to reach. Do not use any sheets or blankets that are too big for your bed. They should not hang down onto the floor. Have a firm chair that has side arms. You can use this for support while you get dressed. Do not have throw rugs and other things on the floor that can make you trip. What can I do in the kitchen? Clean up any spills right away. Avoid walking on wet floors. Keep items that you use a lot in easy-to-reach places. If you need to reach something above you, use a strong step stool that has a grab bar. Keep electrical cords out of the way. Do not use floor polish or wax that makes floors slippery. If you must use wax, use non-skid floor wax. Do not have throw rugs and other things on the floor that can make you trip. What can I do with my stairs? Do not leave any items on the stairs. Make sure that there are handrails on both sides of the stairs  and use them. Fix handrails that are broken or loose. Make sure that handrails are as long as the stairways. Check any carpeting to make sure that it is firmly attached to the stairs. Fix any carpet that is loose or worn. Avoid having throw rugs at the top or bottom of the stairs. If you do have throw rugs, attach them to the floor with carpet tape. Make sure that you have a light switch at the top of the stairs and the bottom of the stairs. If you do not have them, ask someone to add them for you. What else can I do to help prevent falls? Wear shoes that: Do not have high heels. Have rubber bottoms. Are comfortable and fit you well. Are closed at the toe. Do not wear sandals. If you use a  stepladder: Make sure that it is fully opened. Do not climb a closed stepladder. Make sure that both sides of the stepladder are locked into place. Ask someone to hold it for you, if possible. Clearly mark and make sure that you can see: Any grab bars or handrails. First and last steps. Where the edge of each step is. Use tools that help you move around (mobility aids) if they are needed. These include: Canes. Walkers. Scooters. Crutches. Turn on the lights when you go into a dark area. Replace any light bulbs as soon as they burn out. Set up your furniture so you have a clear path. Avoid moving your furniture around. If any of your floors are uneven, fix them. If there are any pets around you, be aware of where they are. Review your medicines with your doctor. Some medicines can make you feel dizzy. This can increase your chance of falling. Ask your doctor what other things that you can do to help prevent falls. This information is not intended to replace advice given to you by your health care provider. Make sure you discuss any questions you have with your health care provider. Document Released: 02/28/2009 Document Revised: 10/10/2015 Document Reviewed: 06/08/2014 Elsevier Interactive Patient Education  2017 [employer].

## 2021-01-17 NOTE — Progress Notes (Signed)
Subjective:   Jeffery Suarez is a 85 y.o. male who presents for Medicare Annual/Subsequent preventive examination. Virtual Visit via Telephone Note  I connected with  Jeffery Suarez on 01/17/21 at 11:15 AM EDT by telephone and verified that I am speaking with the correct person using two identifiers.  Location: Patient: Home Provider: [facility] Persons participating in the virtual visit: patient/Nurse Health Advisor   I discussed the limitations, risks, security and privacy concerns of performing an evaluation and management service by telephone and the availability of in person appointments. The patient expressed understanding and agreed to proceed.  Interactive audio and video telecommunications were attempted between this nurse and patient, however failed, due to patient having technical difficulties OR patient did not have access to video capability.  We continued and completed visit with audio only.  Some vital signs may be absent or patient reported.   Darral Dash, LPN  Review of Systems     Cardiac Risk Factors include: advanced age (>77men, >37 women);hypertension;dyslipidemia;male gender;sedentary lifestyle     Objective:    Today's Vitals   01/17/21 1122 01/17/21 1129  Weight: 237 lb (107.5 kg)   Height: 5\' 11"  (1.803 m)   PainSc:  3    Body mass index is 33.05 kg/m.  Advanced Directives 01/17/2021 01/17/2020 12/23/2018 08/03/2018 07/28/2018  Does Patient Have a Medical Advance Directive? Yes Yes Yes Yes Yes  Type of Advance Directive - Living will Healthcare Power of [place];Living will Living will;Healthcare Power of Attorney Living will;Healthcare Power of Attorney  Does patient want to make changes to medical advance directive? - No - Patient declined No - Patient declined No - Patient declined No - Patient declined  Copy of Healthcare Power of Attorney in Chart? No - copy requested - No - copy requested No - copy requested No - copy requested    Current  Medications (verified) Outpatient Encounter Medications as of 01/17/2021  Medication Sig   diclofenac (VOLTAREN) 75 MG EC tablet Take 1 tablet (75 mg total) by mouth 2 (two) times daily.   irbesartan (AVAPRO) 300 MG tablet Take 1 tablet (300 mg total) by mouth daily.   rosuvastatin (CRESTOR) 10 MG tablet Take 1 tablet (10 mg total) by mouth daily.   No facility-administered encounter medications on file as of 01/17/2021.    Allergies (verified) Shrimp [shellfish allergy]   History: Past Medical History:  Diagnosis Date   Arthritis    Carotid artery occlusion    GERD (gastroesophageal reflux disease)    Hx-TIA (transient ischemic attack)    Hyperlipidemia    Hypertension    PVD (peripheral vascular disease) (HCC)    OCCLUDED LEFT INTERNAL CAROTID ARTERY   Past Surgical History:  Procedure Laterality Date   APPENDECTOMY     CARDIOVASCULAR STRESS TEST  06/07/2006   EF 63%   EYE SURGERY     bilateral cataract surgery with lens implants   TOTAL HIP ARTHROPLASTY Left 08/03/2018   Procedure: TOTAL HIP ARTHROPLASTY ANTERIOR APPROACH;  Surgeon: 08/05/2018, MD;  Location: [facility];  Service: Orthopedics;  Laterality: Left;  Ollen Gross   ECHOCARDIOGRAPHY  02/18/2006   EF 55-60%   Family History  Problem Relation Age of Onset   Heart attack Brother    Cancer Sister    Hypertension Mother    Social History   Socioeconomic History   Marital status: Significant Other    Spouse name: Not on file   Number of children: 1   Years of education:  Not on file   Highest education level: Some college, no degree  Occupational History   Occupation: Retired  Tobacco Use   Smoking status: Former    Types: Cigarettes    Quit date: 05/19/1983    Years since quitting: 37.6   Smokeless tobacco: Never  Vaping Use   Vaping Use: Never used  Substance and Sexual Activity   Alcohol use: No   Drug use: No   Sexual activity: Not Currently  Other Topics Concern   Not on file  Social History  Narrative   Not on file   Social Determinants of Health   Financial Resource Strain: Low Risk    Difficulty of Paying Living Expenses: Not hard at all  Food Insecurity: No Food Insecurity   Worried About Programme researcher, broadcasting/film/video in the Last Year: Never true   Ran Out of Food in the Last Year: Never true  Transportation Needs: No Transportation Needs   Lack of Transportation (Medical): No   Lack of Transportation (Non-Medical): No  Physical Activity: Inactive   Days of Exercise per Week: 0 days   Minutes of Exercise per Session: 0 min  Stress: No Stress Concern Present   Feeling of Stress : Not at all  Social Connections: Moderately Isolated   Frequency of Communication with Friends and Family: More than three times a week   Frequency of Social Gatherings with Friends and Family: More than three times a week   Attends Religious Services: Never   Database administrator or Organizations: Yes   Attends Engineer, structural: More than 4 times per year   Marital Status: Never married    Tobacco Counseling Counseling given: Not Answered   Clinical Intake:  Pre-visit preparation completed: Yes  Pain : 0-10 Pain Score: 3  Pain Type: Chronic pain Pain Location: Hand Pain Descriptors / Indicators: Aching Pain Onset: More than a month ago Pain Relieving Factors: Diclofenac  Pain Relieving Factors: Diclofenac  BMI - recorded: 33.05 Nutritional Status: BMI > 30  Obese Nutritional Risks: None Diabetes: No  How often do you need to have someone help you when you read instructions, pamphlets, or other written materials from your doctor or pharmacy?: 1 - Never  Diabetic?NO  Interpreter Needed?: No  Information entered by :: Venia Carbon, LPN   Activities of Daily Living In your present state of health, do you have any difficulty performing the following activities: 01/17/2021  Hearing? Y  Comment Pt states he has hearing loss but declines hearing aids.  Vision? N   Difficulty concentrating or making decisions? N  Walking or climbing stairs? N  Dressing or bathing? N  Doing errands, shopping? N  Preparing Food and eating ? N  Using the Toilet? N  In the past six months, have you accidently leaked urine? N  Do you have problems with loss of bowel control? N  Managing your Medications? N  Managing your Finances? N  Housekeeping or managing your Housekeeping? N  Some recent data might be hidden    Patient Care Team: Junie Spencer, FNP as PCP - General (Family Medicine)  Indicate any recent Medical Services you may have received from other than [facility] providers in the past year (date may be approximate).     Assessment:   This is a routine wellness examination for [place].  Hearing/Vision screen Hearing Screening - Comments:: Pt states he has some hearing issues but declines hearing aids. Vision Screening - Comments:: Wears glasses. Up to  date on eye exam. Dr. Clelia Croft for eye exams.  Dietary issues and exercise activities discussed: Current Exercise Habits: The patient does not participate in regular exercise at present, Exercise limited by: cardiac condition(s)   Goals Addressed             This Visit's Progress    DIET - EAT MORE FRUITS AND VEGETABLES   On track    Exercise 3x per week (30 min per time)   Not on track    Try to exercise for at least 30 minutes 3 times weekly. Pt states he does not exercise but does mow grass and work out in the yard.       Depression Screen PHQ 2/9 Scores 01/17/2021 06/11/2020 01/17/2020 12/23/2018 12/13/2018 05/05/2018 03/25/2018  PHQ - 2 Score 0 0 0 0 0 0 0    Fall Risk Fall Risk  01/17/2021 06/11/2020 01/17/2020 12/23/2018 12/13/2018  Falls in the past year? 0 0 1 0 0  Number falls in past yr: 0 - 0 0 -  Injury with Fall? 0 - 0 0 -  Follow up Falls prevention discussed - - - -    FALL RISK PREVENTION PERTAINING TO THE HOME:  Any stairs in or around the home? No  If so, are there any without handrails?  Yes  Home free of loose throw rugs in walkways, pet beds, electrical cords, etc? Yes  Adequate lighting in your home to reduce risk of falls? Yes   ASSISTIVE DEVICES UTILIZED TO PREVENT FALLS:  Life alert? No  Use of a cane, walker or w/c? No  Grab bars in the bathroom? Yes  Shower chair or bench in shower? No  Elevated toilet seat or a handicapped toilet? No   TIMED UP AND GO:  Was the test performed? No . Phone visit.    Cognitive Function:     6CIT Screen 01/17/2020 12/23/2018  What Year? 0 points 0 points  What month? 0 points 0 points  What time? 0 points 0 points  Count back from 20 0 points 0 points  Months in reverse 0 points 0 points  Repeat phrase 0 points 0 points  Total Score 0 0    Immunizations Immunization History  Administered Date(s) Administered   Influenza, High Dose Seasonal PF 07/02/2015, 02/10/2017, 03/18/2018, 02/12/2019   Influenza-Unspecified 04/26/2014, 02/16/2020   PFIZER(Purple Top)SARS-COV-2 Vaccination 08/12/2019, 09/02/2019, 03/12/2020   Pneumococcal Conjugate-13 08/12/2016   Pneumococcal Polysaccharide-23 12/13/2018    TDAP status: Due, Education has been provided regarding the importance of this vaccine. Advised may receive this vaccine at local pharmacy or Health Dept. Aware to provide a copy of the vaccination record if obtained from local pharmacy or Health Dept. Verbalized acceptance and understanding.  Flu Vaccine status: Due, Education has been provided regarding the importance of this vaccine. Advised may receive this vaccine at local pharmacy or Health Dept. Aware to provide a copy of the vaccination record if obtained from local pharmacy or Health Dept. Verbalized acceptance and understanding.  Pneumococcal vaccine status: Up to date  Covid-19 vaccine status: Completed vaccines  Qualifies for Shingles Vaccine? Yes   Zostavax completed No   Shingrix Completed?: No.    Education has been provided regarding the importance of this  vaccine. Patient has been advised to call insurance company to determine out of pocket expense if they have not yet received this vaccine. Advised may also receive vaccine at local pharmacy or Health Dept. Verbalized acceptance and understanding.  Screening Tests Health Maintenance  Topic Date Due   TETANUS/TDAP  Never done   Zoster Vaccines- Shingrix (1 of 2) Never done   COVID-19 Vaccine (4 - Booster for [employer] series) 06/12/2020   INFLUENZA VACCINE  12/16/2020   PNA vac Low Risk Adult  Completed   HPV VACCINES  Aged Out    Health Maintenance  Health Maintenance Due  Topic Date Due   TETANUS/TDAP  Never done   Zoster Vaccines- Shingrix (1 of 2) Never done   COVID-19 Vaccine (4 - Booster for Pfizer series) 06/12/2020   INFLUENZA VACCINE  12/16/2020    Colorectal cancer screening: No longer required.   Lung Cancer Screening: (Low Dose CT Chest recommended if Age 32-80 years, 30 pack-year currently smoking OR have quit w/in 15years.) does not qualify.    Additional Screening:  Hepatitis C Screening: does not qualify;  Vision Screening: Recommended annual ophthalmology exams for early detection of glaucoma and other disorders of the eye. Is the patient up to date with their annual eye exam?  Yes  Who is the provider or what is the name of the office in which the patient attends annual eye exams? Dr. Clelia CroftShaw If pt is not established with a provider, would they like to be referred to a provider to establish care? No .   Dental Screening: Recommended annual dental exams for proper oral hygiene  Community Resource Referral / Chronic Care Management: CRR required this visit?  No   CCM required this visit?  No      Plan:     I have personally reviewed and noted the following in the patient's chart:   Medical and social history Use of alcohol, tobacco or illicit drugs  Current medications and supplements including opioid prescriptions. Patient is not currently taking opioid  prescriptions. Functional ability and status Nutritional status Physical activity Advanced directives List of other physicians Hospitalizations, surgeries, and ER visits in previous 12 months Vitals Screenings to include cognitive, depression, and falls Referrals and appointments  In addition, I have reviewed and discussed with patient certain preventive protocols, quality metrics, and best practice recommendations. A written personalized care plan for preventive services as well as general preventive health recommendations were provided to patient.     Darral DashMary Jane K Nafeesa Dils, LPN   9/6/04549/06/2020   Nurse Notes: Last office visit with Jannifer Rodneyhristy Hawks, FNP was 06/11/2020. Offered to make follow up appointment but patient declined.

## 2021-01-21 NOTE — Telephone Encounter (Signed)
Error

## 2021-02-27 ENCOUNTER — Other Ambulatory Visit: Payer: Self-pay | Admitting: Family

## 2021-02-27 DIAGNOSIS — E785 Hyperlipidemia, unspecified: Secondary | ICD-10-CM

## 2021-02-27 DIAGNOSIS — I739 Peripheral vascular disease, unspecified: Secondary | ICD-10-CM

## 2021-02-27 NOTE — Telephone Encounter (Signed)
[facility]. NTBS Last OV 06/11/20 was to have a 6 mos ckup. Mail order not sent

## 2021-03-03 DIAGNOSIS — Z23 Encounter for immunization: Secondary | ICD-10-CM | POA: Diagnosis not present

## 2021-03-07 ENCOUNTER — Telehealth: Payer: Self-pay | Admitting: Family

## 2021-03-07 DIAGNOSIS — M150 Primary generalized (osteo)arthritis: Secondary | ICD-10-CM

## 2021-03-07 DIAGNOSIS — I10 Essential (primary) hypertension: Secondary | ICD-10-CM

## 2021-03-07 DIAGNOSIS — I739 Peripheral vascular disease, unspecified: Secondary | ICD-10-CM

## 2021-03-07 DIAGNOSIS — M159 Polyosteoarthritis, unspecified: Secondary | ICD-10-CM

## 2021-03-07 DIAGNOSIS — E785 Hyperlipidemia, unspecified: Secondary | ICD-10-CM

## 2021-03-07 NOTE — Telephone Encounter (Signed)
  Prescription Request  03/07/2021  Is this a "Controlled Substance" medicine? No  Have you seen your PCP in the last 2 weeks? No  If YES, route message to pool  -  If NO, patient needs to be scheduled for appointment.  What is the name of the medication or equipment? Rosuvastatin 10 mg   Have you contacted your pharmacy to request a refill? yes  Which pharmacy would you like this sent to? [facility]   Patient notified that their request is being sent to the clinical staff for review and that they should receive a response within 2 business days.

## 2021-03-10 MED ORDER — ROSUVASTATIN CALCIUM 10 MG PO TABS: 10.0000 mg | ORAL_TABLET | Freq: Every day | ORAL | 0 refills | Status: DC

## 2021-03-10 MED ORDER — DICLOFENAC SODIUM 75 MG PO TBEC: 75.0000 mg | DELAYED_RELEASE_TABLET | Freq: Two times a day (BID) | ORAL | 0 refills | Status: DC

## 2021-03-10 NOTE — Telephone Encounter (Signed)
Pt aware refill sent to pharmacy 

## 2021-03-10 NOTE — Addendum Note (Signed)
Addended by: Julious Payer D on: 03/10/2021 01:49 PM   Modules accepted: Orders

## 2021-03-21 ENCOUNTER — Ambulatory Visit: Payer: Medicare Other | Admitting: Family

## 2021-03-27 ENCOUNTER — Other Ambulatory Visit: Payer: Self-pay

## 2021-03-27 ENCOUNTER — Ambulatory Visit (INDEPENDENT_AMBULATORY_CARE_PROVIDER_SITE_OTHER): Payer: Medicare Other | Admitting: Family

## 2021-03-27 ENCOUNTER — Encounter: Payer: Self-pay | Admitting: Family

## 2021-03-27 VITALS — BP 185/70 | HR 77 | Temp 97.7°F | Ht 71.0 in | Wt 239.6 lb

## 2021-03-27 DIAGNOSIS — M19041 Primary osteoarthritis, right hand: Secondary | ICD-10-CM | POA: Diagnosis not present

## 2021-03-27 DIAGNOSIS — I739 Peripheral vascular disease, unspecified: Secondary | ICD-10-CM | POA: Diagnosis not present

## 2021-03-27 DIAGNOSIS — I10 Essential (primary) hypertension: Secondary | ICD-10-CM | POA: Diagnosis not present

## 2021-03-27 DIAGNOSIS — E669 Obesity, unspecified: Secondary | ICD-10-CM | POA: Diagnosis not present

## 2021-03-27 DIAGNOSIS — M19042 Primary osteoarthritis, left hand: Secondary | ICD-10-CM | POA: Diagnosis not present

## 2021-03-27 DIAGNOSIS — E785 Hyperlipidemia, unspecified: Secondary | ICD-10-CM | POA: Diagnosis not present

## 2021-03-27 NOTE — Progress Notes (Signed)
Subjective:    Patient ID: Jeffery Suarez, male    DOB: 1936-01-26, 85 y.o.   MRN: [id number]  Chief Complaint  Patient presents with   Medical Management of Chronic Issues   Pt presents to the office today for chronic follow up.  Hypertension This is a chronic problem. The current episode started more than 1 year ago. The problem has been waxing and waning since onset. The problem is uncontrolled. Pertinent negatives include no malaise/fatigue, peripheral edema or shortness of breath. Risk factors for coronary artery disease include dyslipidemia, obesity and male gender. The current treatment provides mild improvement.  Arthritis Presents for follow-up visit. He complains of pain and stiffness. The symptoms have been stable. Affected locations include the left MCP, right MCP, left hip and right hip. His pain is at a severity of 10/10.  Hyperlipidemia This is a chronic problem. The current episode started more than 1 year ago. The problem is uncontrolled. Exacerbating diseases include obesity. Pertinent negatives include no shortness of breath. Current antihyperlipidemic treatment includes statins. Risk factors for coronary artery disease include male sex, hypertension, a sedentary lifestyle, dyslipidemia and obesity.     Review of Systems  Constitutional:  Negative for malaise/fatigue.  Respiratory:  Negative for shortness of breath.   Musculoskeletal:  Positive for arthritis and stiffness.  All other systems reviewed and are negative.     Objective:   Physical Exam Vitals reviewed.  Constitutional:      General: He is not in acute distress.    Appearance: He is well-developed. He is obese.  HENT:     Head: Normocephalic.     Right Ear: Tympanic membrane normal.     Left Ear: Tympanic membrane normal.  Eyes:     General:        Right eye: No discharge.        Left eye: No discharge.     Pupils: Pupils are equal, round, and reactive to light.  Neck:     Thyroid: No  thyromegaly.  Cardiovascular:     Rate and Rhythm: Normal rate and regular rhythm.     Heart sounds: Normal heart sounds. No murmur heard. Pulmonary:     Effort: Pulmonary effort is normal. No respiratory distress.     Breath sounds: Normal breath sounds. No wheezing.  Abdominal:     General: Bowel sounds are normal. There is no distension.     Palpations: Abdomen is soft.     Tenderness: There is no abdominal tenderness.  Musculoskeletal:        General: No tenderness. Normal range of motion.     Cervical back: Normal range of motion and neck supple.  Skin:    General: Skin is warm and dry.     Findings: No erythema or rash.  Neurological:     Mental Status: He is alert and oriented to person, place, and time.     Cranial Nerves: No cranial nerve deficit.     Deep Tendon Reflexes: Reflexes are normal and symmetric.  Psychiatric:        Behavior: Behavior normal.        Thought Content: Thought content normal.        Judgment: Judgment normal.      BP (!) 185/70   Pulse 77   Temp 97.7 F (36.5 C) (Temporal)   Ht '5\' 11"'  (1.803 m)   Wt 239 lb 9.6 oz (108.7 kg)   BMI 33.42 kg/m      Assessment &  Plan:  Jeffery Suarez comes in today with chief complaint of Medical Management of Chronic Issues   Diagnosis and orders addressed:  1. Primary hypertension - CMP14+EGFR - CBC with Differential/Platelet  2. Peripheral vascular disease ([place]) - CMP14+EGFR - CBC with Differential/Platelet  3. Hyperlipidemia, unspecified hyperlipidemia type - CMP14+EGFR - CBC with Differential/Platelet  4. Obesity (BMI 30-39.9) - CMP14+EGFR - CBC with Differential/Platelet  5. Primary osteoarthritis of both hands - CMP14+EGFR - CBC with Differential/Platelet   Labs pending Health Maintenance reviewed Diet and exercise encouraged  Follow up plan: 6 months   Evelina Dun, FNP

## 2021-03-27 NOTE — Patient Instructions (Signed)

## 2021-03-28 LAB — CMP14+EGFR
ALT: 8 IU/L (ref 0–44)
AST: 22 IU/L (ref 0–40)
Albumin/Globulin Ratio: 1.7 (ref 1.2–2.2)
Albumin: 4.6 g/dL (ref 3.6–4.6)
Alkaline Phosphatase: 113 IU/L (ref 44–121)
BUN/Creatinine Ratio: 21 (ref 10–24)
BUN: 24 mg/dL (ref 8–27)
Bilirubin Total: 0.7 mg/dL (ref 0.0–1.2)
CO2: 24 mmol/L (ref 20–29)
Calcium: 9.8 mg/dL (ref 8.6–10.2)
Chloride: 101 mmol/L (ref 96–106)
Creatinine, Ser: 1.17 mg/dL (ref 0.76–1.27)
Globulin, Total: 2.7 g/dL (ref 1.5–4.5)
Glucose: 97 mg/dL (ref 70–99)
Potassium: 4.4 mmol/L (ref 3.5–5.2)
Sodium: 139 mmol/L (ref 134–144)
Total Protein: 7.3 g/dL (ref 6.0–8.5)
eGFR: 62 mL/min/{1.73_m2} (ref >59)

## 2021-03-28 LAB — CBC WITH DIFFERENTIAL/PLATELET
Basophils Absolute: 0 10*3/uL (ref 0.0–0.2)
Basos: 1 %
EOS (ABSOLUTE): 0.1 10*3/uL (ref 0.0–0.4)
Eos: 2 %
Hematocrit: 42.6 % (ref 37.5–51.0)
Hemoglobin: 14 g/dL (ref 13.0–17.7)
Immature Grans (Abs): 0 10*3/uL (ref 0.0–0.1)
Immature Granulocytes: 0 %
Lymphocytes Absolute: 1.9 10*3/uL (ref 0.7–3.1)
Lymphs: 31 %
MCH: 29 pg (ref 26.6–33.0)
MCHC: 32.9 g/dL (ref 31.5–35.7)
MCV: 88 fL (ref 79–97)
Monocytes Absolute: 0.5 10*3/uL (ref 0.1–0.9)
Monocytes: 9 %
Neutrophils Absolute: 3.5 10*3/uL (ref 1.4–7.0)
Neutrophils: 57 %
Platelets: 235 10*3/uL (ref 150–450)
RBC: 4.82 x10E6/uL (ref 4.14–5.80)
RDW: 13.2 % (ref 11.6–15.4)
WBC: 6 10*3/uL (ref 3.4–10.8)

## 2021-06-11 ENCOUNTER — Telehealth: Payer: Self-pay | Admitting: Family

## 2021-06-11 DIAGNOSIS — E785 Hyperlipidemia, unspecified: Secondary | ICD-10-CM

## 2021-06-11 DIAGNOSIS — I10 Essential (primary) hypertension: Secondary | ICD-10-CM

## 2021-06-11 DIAGNOSIS — I739 Peripheral vascular disease, unspecified: Secondary | ICD-10-CM

## 2021-06-11 MED ORDER — ROSUVASTATIN CALCIUM 10 MG PO TABS: 10.0000 mg | ORAL_TABLET | Freq: Every day | ORAL | 1 refills | Status: DC

## 2021-06-11 MED ORDER — IRBESARTAN 300 MG PO TABS: 300.0000 mg | ORAL_TABLET | Freq: Every day | ORAL | 1 refills | Status: DC

## 2021-06-11 NOTE — Telephone Encounter (Signed)
°  Prescription Request  06/11/2021  Is this a "Controlled Substance" medicine? No  Have you seen your PCP in the last 2 weeks? no  If YES, route message to pool  -  If NO, patient needs to be scheduled for appointment.  What is the name of the medication or equipment? Crestor (Generic) & Ibrostatin (Generic)  Have you contacted your pharmacy to request a refill? no   Which pharmacy would you like this sent to? [facility]   Patient notified that their request is being sent to the clinical staff for review and that they should receive a response within 2 business days.   Lenna Gilford' pt.  Please call pt bc last time he was out of meds for 2 weeks.

## 2021-06-11 NOTE — Telephone Encounter (Signed)
Pt aware refills sent to [facility] 

## 2021-07-10 ENCOUNTER — Encounter: Payer: Self-pay | Admitting: Family

## 2021-07-10 ENCOUNTER — Ambulatory Visit (INDEPENDENT_AMBULATORY_CARE_PROVIDER_SITE_OTHER): Payer: Medicare Other | Admitting: Family

## 2021-07-10 VITALS — BP 124/65 | HR 90 | Temp 99.3°F | Ht 71.0 in | Wt 242.0 lb

## 2021-07-10 DIAGNOSIS — E785 Hyperlipidemia, unspecified: Secondary | ICD-10-CM

## 2021-07-10 DIAGNOSIS — I10 Essential (primary) hypertension: Secondary | ICD-10-CM | POA: Diagnosis not present

## 2021-07-10 DIAGNOSIS — I739 Peripheral vascular disease, unspecified: Secondary | ICD-10-CM

## 2021-07-10 DIAGNOSIS — M159 Polyosteoarthritis, unspecified: Secondary | ICD-10-CM

## 2021-07-10 DIAGNOSIS — E669 Obesity, unspecified: Secondary | ICD-10-CM

## 2021-07-10 MED ORDER — DICLOFENAC SODIUM 75 MG PO TBEC: 75.0000 mg | DELAYED_RELEASE_TABLET | Freq: Two times a day (BID) | ORAL | 0 refills | Status: DC

## 2021-07-10 MED ORDER — IRBESARTAN 300 MG PO TABS: 300.0000 mg | ORAL_TABLET | Freq: Every day | ORAL | 1 refills | Status: DC

## 2021-07-10 MED ORDER — ROSUVASTATIN CALCIUM 10 MG PO TABS: 10.0000 mg | ORAL_TABLET | Freq: Every day | ORAL | 1 refills | Status: DC

## 2021-07-10 NOTE — Progress Notes (Signed)
Subjective:    Patient ID: Jeffery Suarez, male    DOB: 01-11-1936, 86 y.o.   MRN: [id number]  Chief Complaint  Patient presents with   Medical Management of Chronic Issues   Pt presents to the office today for chronic follow up.  Hypertension This is a chronic problem. The current episode started more than 1 year ago. The problem has been resolved since onset. The problem is controlled. Pertinent negatives include no malaise/fatigue, peripheral edema or shortness of breath. Risk factors for coronary artery disease include obesity and male gender. The current treatment provides moderate improvement.  Arthritis Presents for follow-up visit. He complains of pain and stiffness. The symptoms have been stable. Affected locations include the left wrist, right wrist, left knee, right knee, left hip and right hip. His pain is at a severity of 7/10.  Hyperlipidemia This is a chronic problem. The current episode started more than 1 year ago. The problem is uncontrolled. Exacerbating diseases include obesity. Pertinent negatives include no shortness of breath. Current antihyperlipidemic treatment includes statins. The current treatment provides moderate improvement of lipids. Risk factors for coronary artery disease include dyslipidemia, male sex, hypertension and a sedentary lifestyle.     Review of Systems  Constitutional:  Negative for malaise/fatigue.  Respiratory:  Negative for shortness of breath.   Musculoskeletal:  Positive for arthritis and stiffness.  All other systems reviewed and are negative.     Objective:   Physical Exam Vitals reviewed.  Constitutional:      General: He is not in acute distress.    Appearance: He is well-developed. He is obese.  HENT:     Head: Normocephalic.     Right Ear: Tympanic membrane normal.     Left Ear: Tympanic membrane normal.  Eyes:     General:        Right eye: No discharge.        Left eye: No discharge.     Pupils: Pupils are equal,  round, and reactive to light.  Neck:     Thyroid: No thyromegaly.  Cardiovascular:     Rate and Rhythm: Normal rate and regular rhythm.     Heart sounds: Murmur heard.  Pulmonary:     Effort: Pulmonary effort is normal. No respiratory distress.     Breath sounds: Normal breath sounds. No wheezing.  Abdominal:     General: Bowel sounds are normal. There is no distension.     Palpations: Abdomen is soft.     Tenderness: There is no abdominal tenderness.  Musculoskeletal:        General: Tenderness present. Normal range of motion.     Cervical back: Normal range of motion and neck supple.     Comments: Pain in bilateral wrist and hand with flexion and extension  Skin:    General: Skin is warm and dry.     Findings: No erythema or rash.  Neurological:     Mental Status: He is alert and oriented to person, place, and time.     Cranial Nerves: No cranial nerve deficit.     Deep Tendon Reflexes: Reflexes are normal and symmetric.  Psychiatric:        Behavior: Behavior normal.        Thought Content: Thought content normal.        Judgment: Judgment normal.      BP 124/65    Pulse 90    Temp 99.3 F (37.4 C) (Temporal)    Ht 5' 11" (  1.803 m)    Wt 242 lb (109.8 kg)    BMI 33.75 kg/m      Assessment & Plan:  Jeffery Suarez comes in today with chief complaint of Medical Management of Chronic Issues   Diagnosis and orders addressed:  1. Primary osteoarthritis involving multiple joints Restart diclofenac No other NSAIDs - diclofenac (VOLTAREN) 75 MG EC tablet; Take 1 tablet (75 mg total) by mouth 2 (two) times daily.  Dispense: 180 tablet; Refill: 0 - CMP14+EGFR  2. Peripheral vascular disease (HCC) - rosuvastatin (CRESTOR) 10 MG tablet; Take 1 tablet (10 mg total) by mouth daily.  Dispense: 90 tablet; Refill: 1 - CMP14+EGFR  3. Hyperlipidemia, unspecified hyperlipidemia type - rosuvastatin (CRESTOR) 10 MG tablet; Take 1 tablet (10 mg total) by mouth daily.  Dispense: 90  tablet; Refill: 1 - CMP14+EGFR  4. Essential hypertension - irbesartan (AVAPRO) 300 MG tablet; Take 1 tablet (300 mg total) by mouth daily.  Dispense: 90 tablet; Refill: 1 - CMP14+EGFR  5. Primary hypertension - CMP14+EGFR  6. Obesity (BMI 30-39.9) - CMP14+EGFR   Labs pending Health Maintenance reviewed Diet and exercise encouraged  Follow up plan: 6 months   Evelina Dun, FNP

## 2021-07-10 NOTE — Patient Instructions (Signed)
Osteoarthritis Osteoarthritis is a type of arthritis. It refers to joint pain or joint disease. Osteoarthritis affects tissue that covers the ends of bones in joints (cartilage). Cartilage acts as a cushion between the bones and helps them move smoothly. Osteoarthritis occurs when cartilage in the joints gets worn down. Osteoarthritis is sometimes called "wear and tear" arthritis. Osteoarthritis is the most common form of arthritis. It often occurs in older people. It is a condition that gets worse over time. The joints most often affected by this condition are in the fingers, toes, hips, knees, and spine, including the neck and lower back. What are the causes? This condition is caused by the wearing down of cartilage that covers the ends of bones. What increases the risk? The following factors may make you more likely to develop this condition: Being age 50 or older. Obesity. Overuse of joints. Past injury of a joint. Past surgery on a joint. Family history of osteoarthritis. What are the signs or symptoms? The main symptoms of this condition are pain, swelling, and stiffness in the joint. Other symptoms may include: An enlarged joint. More pain and further damage caused by small pieces of bone or cartilage that break off and float inside of the joint. Small deposits of bone (osteophytes) that grow on the edges of the joint. A grating or scraping feeling inside the joint when you move it. Popping or creaking sounds when you move. Difficulty walking or exercising. An inability to grip items, twist your hand(s), or control the movements of your hands and fingers. How is this diagnosed? This condition may be diagnosed based on: Your medical history. A physical exam. Your symptoms. X-rays of the affected joint(s). Blood tests to rule out other types of arthritis. How is this treated? There is no cure for this condition, but treatment can help control pain and improve joint function.  Treatment may include a combination of therapies, such as: Pain relief techniques, such as: Applying heat and cold to the joint. Massage. A form of talk therapy called cognitive behavioral therapy (CBT). This therapy helps you set goals and follow up on the changes that you make. Medicines for pain and inflammation. The medicines can be taken by mouth or applied to the skin. They include: NSAIDs, such as ibuprofen. Prescription medicines. Strong anti-inflammatory medicines (corticosteroids). Certain nutritional supplements. A prescribed exercise program. You may work with a physical therapist. Assistive devices, such as a brace, wrap, splint, specialized glove, or cane. A weight control plan. Surgery, such as: An osteotomy. This is done to reposition the bones and relieve pain or to remove loose pieces of bone and cartilage. Joint replacement surgery. You may need this surgery if you have advanced osteoarthritis. Follow these instructions at home: Activity Rest your affected joints as told by your health care provider. Exercise as told by your health care provider. He or she may recommend specific types of exercise, such as: Strengthening exercises. These are done to strengthen the muscles that support joints affected by arthritis. Aerobic activities. These are exercises, such as brisk walking or water aerobics, that increase your heart rate. Range-of-motion activities. These help your joints move more easily. Balance and agility exercises. Managing pain, stiffness, and swelling   If directed, apply heat to the affected area as often as told by your health care provider. Use the heat source that your health care provider recommends, such as a moist heat pack or a heating pad. If you have a removable assistive device, remove it as told by   your health care provider. Place a towel between your skin and the heat source. If your health care provider tells you to keep the assistive device on  while you apply heat, place a towel between the assistive device and the heat source. Leave the heat on for 20-30 minutes. Remove the heat if your skin turns bright red. This is especially important if you are unable to feel pain, heat, or cold. You may have a greater risk of getting burned. If directed, put ice on the affected area. To do this: If you have a removable assistive device, remove it as told by your health care provider. Put ice in a plastic bag. Place a towel between your skin and the bag. If your health care provider tells you to keep the assistive device on during icing, place a towel between the assistive device and the bag. Leave the ice on for 20 minutes, 2-3 times a day. Move your fingers or toes often to reduce stiffness and swelling. Raise (elevate) the injured area above the level of your heart while you are sitting or lying down. General instructions Take over-the-counter and prescription medicines only as told by your health care provider. Maintain a healthy weight. Follow instructions from your health care provider for weight control. Do not use any products that contain nicotine or tobacco, such as cigarettes, e-cigarettes, and chewing tobacco. If you need help quitting, ask your health care provider. Use assistive devices as told by your health care provider. Keep all follow-up visits as told by your health care provider. This is important. Where to find more information National Institute of Arthritis and Musculoskeletal and Skin Diseases: www.niams.nih.gov National Institute on Aging: www.nia.nih.gov American College of Rheumatology: www.rheumatology.org Contact a health care provider if: You have redness, swelling, or a feeling of warmth in a joint that gets worse. You have a fever along with joint or muscle aches. You develop a rash. You have trouble doing your normal activities. Get help right away if: You have pain that gets worse and is not relieved by  pain medicine. Summary Osteoarthritis is a type of arthritis that affects tissue covering the ends of bones in joints (cartilage). This condition is caused by the wearing down of cartilage that covers the ends of bones. The main symptom of this condition is pain, swelling, and stiffness in the joint. There is no cure for this condition, but treatment can help control pain and improve joint function. This information is not intended to replace advice given to you by your health care provider. Make sure you discuss any questions you have with your health care provider. Document Revised: 05/01/2019 Document Reviewed: 05/01/2019 Elsevier Patient Education  2022 Elsevier Inc.  

## 2021-07-11 LAB — CMP14+EGFR
ALT: 28 IU/L (ref 0–44)
AST: 22 IU/L (ref 0–40)
Albumin/Globulin Ratio: 1.6 (ref 1.2–2.2)
Albumin: 4.6 g/dL (ref 3.6–4.6)
Alkaline Phosphatase: 94 IU/L (ref 44–121)
BUN/Creatinine Ratio: 18 (ref 10–24)
BUN: 24 mg/dL (ref 8–27)
Bilirubin Total: 1 mg/dL (ref 0.0–1.2)
CO2: 23 mmol/L (ref 20–29)
Calcium: 9.3 mg/dL (ref 8.6–10.2)
Chloride: 100 mmol/L (ref 96–106)
Creatinine, Ser: 1.31 mg/dL — ABNORMAL HIGH (ref 0.76–1.27)
Globulin, Total: 2.9 g/dL (ref 1.5–4.5)
Glucose: 98 mg/dL (ref 70–99)
Potassium: 4.7 mmol/L (ref 3.5–5.2)
Sodium: 136 mmol/L (ref 134–144)
Total Protein: 7.5 g/dL (ref 6.0–8.5)
eGFR: 54 mL/min/{1.73_m2} — ABNORMAL LOW (ref >59)

## 2021-07-15 ENCOUNTER — Ambulatory Visit: Payer: Medicare Other | Admitting: Family

## 2022-01-21 ENCOUNTER — Ambulatory Visit: Payer: Medicare Other

## 2022-03-23 DIAGNOSIS — Z23 Encounter for immunization: Secondary | ICD-10-CM | POA: Diagnosis not present

## 2022-07-24 ENCOUNTER — Ambulatory Visit (INDEPENDENT_AMBULATORY_CARE_PROVIDER_SITE_OTHER): Payer: Medicare Other | Admitting: Family

## 2022-07-24 ENCOUNTER — Encounter: Payer: Self-pay | Admitting: Family

## 2022-07-24 VITALS — BP 149/64 | HR 73 | Temp 98.2°F | Ht 71.0 in | Wt 246.0 lb

## 2022-07-24 DIAGNOSIS — Z6834 Body mass index (BMI) 34.0-34.9, adult: Secondary | ICD-10-CM

## 2022-07-24 DIAGNOSIS — M19041 Primary osteoarthritis, right hand: Secondary | ICD-10-CM

## 2022-07-24 DIAGNOSIS — I739 Peripheral vascular disease, unspecified: Secondary | ICD-10-CM | POA: Diagnosis not present

## 2022-07-24 DIAGNOSIS — I10 Essential (primary) hypertension: Secondary | ICD-10-CM

## 2022-07-24 DIAGNOSIS — E785 Hyperlipidemia, unspecified: Secondary | ICD-10-CM

## 2022-07-24 DIAGNOSIS — E669 Obesity, unspecified: Secondary | ICD-10-CM | POA: Diagnosis not present

## 2022-07-24 DIAGNOSIS — M19042 Primary osteoarthritis, left hand: Secondary | ICD-10-CM

## 2022-07-24 MED ORDER — IRBESARTAN 300 MG PO TABS: 300.0000 mg | ORAL_TABLET | Freq: Every day | ORAL | 1 refills | Status: DC

## 2022-07-24 MED ORDER — ROSUVASTATIN CALCIUM 10 MG PO TABS: 10.0000 mg | ORAL_TABLET | Freq: Every day | ORAL | 1 refills | Status: DC

## 2022-07-24 NOTE — Progress Notes (Signed)
Subjective:    Patient ID: Jeffery Suarez, male    DOB: [birth year], 87 y.o.   MRN: [id number]  Chief Complaint  Patient presents with   Medical Management of Chronic Issues   Pt presents to the office today for chronic follow up.  Pt has been out of all his medication for the last 6 months.   He has PVD.  Hypertension This is a chronic problem. The current episode started more than 1 year ago. The problem has been waxing and waning since onset. The problem is uncontrolled. Pertinent negatives include no malaise/fatigue, peripheral edema or shortness of breath. Risk factors for coronary artery disease include obesity, dyslipidemia and male gender. The current treatment provides moderate improvement.  Arthritis Presents for follow-up visit. He complains of pain and stiffness. Affected locations include the left MCP, right MCP, right knee and left knee. His pain is at a severity of 8/10.  Hyperlipidemia This is a chronic problem. The current episode started more than 1 year ago. Pertinent negatives include no shortness of breath. Current antihyperlipidemic treatment includes statins. The current treatment provides mild improvement of lipids. Risk factors for coronary artery disease include dyslipidemia, hypertension and male sex.      Review of Systems  Constitutional:  Negative for malaise/fatigue.  Respiratory:  Negative for shortness of breath.   Musculoskeletal:  Positive for arthritis and stiffness.  All other systems reviewed and are negative.      Objective:   Physical Exam Vitals reviewed.  Constitutional:      General: He is not in acute distress.    Appearance: He is well-developed. He is obese.  HENT:     Head: Normocephalic.     Right Ear: Tympanic membrane normal.     Left Ear: Tympanic membrane normal.  Eyes:     General:        Right eye: No discharge.        Left eye: No discharge.     Pupils: Pupils are equal, round, and reactive to light.  Neck:      Thyroid: No thyromegaly.  Cardiovascular:     Rate and Rhythm: Normal rate and regular rhythm.     Heart sounds: Normal heart sounds. No murmur heard. Pulmonary:     Effort: Pulmonary effort is normal. No respiratory distress.     Breath sounds: Normal breath sounds. No wheezing.  Abdominal:     General: Bowel sounds are normal. There is no distension.     Palpations: Abdomen is soft.     Tenderness: There is no abdominal tenderness.  Musculoskeletal:        General: Tenderness (pain in bilateral hands and knees with flexion) present. Normal range of motion.     Cervical back: Normal range of motion and neck supple.  Skin:    General: Skin is warm and dry.     Findings: No erythema or rash.  Neurological:     Mental Status: He is alert and oriented to person, place, and time.     Cranial Nerves: No cranial nerve deficit.     Deep Tendon Reflexes: Reflexes are normal and symmetric.  Psychiatric:        Behavior: Behavior normal.        Thought Content: Thought content normal.        Judgment: Judgment normal.       BP (!) 185/69   Pulse 73   Temp 98.2 F (36.8 C) (Temporal)   Ht '5\' 11"'$  (1.803 m)  Wt 246 lb (111.6 kg)   SpO2 97%   BMI 34.31 kg/m      Assessment & Plan:   Jakendrick Antal comes in today with chief complaint of Medical Management of Chronic Issues   Diagnosis and orders addressed:  1. Essential hypertension Restart irbesartan  -Dash diet information given -Exercise encouraged - Stress Management  -Continue current meds -RTO in 1 month  - irbesartan (AVAPRO) 300 MG tablet; Take 1 tablet (300 mg total) by mouth daily.  Dispense: 90 tablet; Refill: 1 - CMP14+EGFR  2. Peripheral vascular disease (HCC) - rosuvastatin (CRESTOR) 10 MG tablet; Take 1 tablet (10 mg total) by mouth daily.  Dispense: 90 tablet; Refill: 1 - CMP14+EGFR  3. Hyperlipidemia, unspecified hyperlipidemia type - rosuvastatin (CRESTOR) 10 MG tablet; Take 1 tablet (10 mg total) by  mouth daily.  Dispense: 90 tablet; Refill: 1 - CMP14+EGFR  4. Primary hypertension - CMP14+EGFR  5. Obesity (BMI 30-39.9) - CMP14+EGFR  6. Primary osteoarthritis of both hands - CMP14+EGFR   Labs pending Restart medications  Health Maintenance reviewed Diet and exercise encouraged  Follow up plan: 1 month to recheck HTN   Evelina Dun, FNP

## 2022-07-24 NOTE — Patient Instructions (Signed)
Hypertension, Adult High blood pressure (hypertension) is when the force of blood pumping through the arteries is too strong. The arteries are the blood vessels that carry blood from the heart throughout the body. Hypertension forces the heart to work harder to pump blood and may cause arteries to become narrow or stiff. Untreated or uncontrolled hypertension can lead to a heart attack, heart failure, a stroke, kidney disease, and other problems. A blood pressure reading consists of a higher number over a lower number. Ideally, your blood pressure should be below 120/80. The first ("top") number is called the systolic pressure. It is a measure of the pressure in your arteries as your heart beats. The second ("bottom") number is called the diastolic pressure. It is a measure of the pressure in your arteries as the heart relaxes. What are the causes? The exact cause of this condition is not known. There are some conditions that result in high blood pressure. What increases the risk? Certain factors may make you more likely to develop high blood pressure. Some of these risk factors are under your control, including: Smoking. Not getting enough exercise or physical activity. Being overweight. Having too much fat, sugar, calories, or salt (sodium) in your diet. Drinking too much alcohol. Other risk factors include: Having a personal history of heart disease, diabetes, high cholesterol, or kidney disease. Stress. Having a family history of high blood pressure and high cholesterol. Having obstructive sleep apnea. Age. The risk increases with age. What are the signs or symptoms? High blood pressure may not cause symptoms. Very high blood pressure (hypertensive crisis) may cause: Headache. Fast or irregular heartbeats (palpitations). Shortness of breath. Nosebleed. Nausea and vomiting. Vision changes. Severe chest pain, dizziness, and seizures. How is this diagnosed? This condition is diagnosed by  measuring your blood pressure while you are seated, with your arm resting on a flat surface, your legs uncrossed, and your feet flat on the floor. The cuff of the blood pressure monitor will be placed directly against the skin of your upper arm at the level of your heart. Blood pressure should be measured at least twice using the same arm. Certain conditions can cause a difference in blood pressure between your right and left arms. If you have a high blood pressure reading during one visit or you have normal blood pressure with other risk factors, you may be asked to: Return on a different day to have your blood pressure checked again. Monitor your blood pressure at home for 1 week or longer. If you are diagnosed with hypertension, you may have other blood or imaging tests to help your health care provider understand your overall risk for other conditions. How is this treated? This condition is treated by making healthy lifestyle changes, such as eating healthy foods, exercising more, and reducing your alcohol intake. You may be referred for counseling on a healthy diet and physical activity. Your health care provider may prescribe medicine if lifestyle changes are not enough to get your blood pressure under control and if: Your systolic blood pressure is above 130. Your diastolic blood pressure is above 80. Your personal target blood pressure may vary depending on your medical conditions, your age, and other factors. Follow these instructions at home: Eating and drinking  Eat a diet that is high in fiber and potassium, and low in sodium, added sugar, and fat. An example of this eating plan is called the DASH diet. DASH stands for Dietary Approaches to Stop Hypertension. To eat this way: Eat   plenty of fresh fruits and vegetables. Try to fill one half of your plate at each meal with fruits and vegetables. Eat whole grains, such as whole-wheat pasta, brown rice, or whole-grain bread. Fill about one  fourth of your plate with whole grains. Eat or drink low-fat dairy products, such as skim milk or low-fat yogurt. Avoid fatty cuts of meat, processed or cured meats, and poultry with skin. Fill about one fourth of your plate with lean proteins, such as fish, chicken without skin, beans, eggs, or tofu. Avoid pre-made and processed foods. These tend to be higher in sodium, added sugar, and fat. Reduce your daily sodium intake. Many people with hypertension should eat less than 1,500 mg of sodium a day. Do not drink alcohol if: Your health care provider tells you not to drink. You are pregnant, may be pregnant, or are planning to become pregnant. If you drink alcohol: Limit how much you have to: 0-1 drink a day for women. 0-2 drinks a day for men. Know how much alcohol is in your drink. In the [place] one drink equals one 12 oz bottle of beer (355 mL), one 5 oz glass of wine (148 mL), or one 1 oz glass of hard liquor (44 mL). Lifestyle  Work with your health care provider to maintain a healthy body weight or to lose weight. Ask what an ideal weight is for you. Get at least 30 minutes of exercise that causes your heart to beat faster (aerobic exercise) most days of the week. Activities may include walking, swimming, or biking. Include exercise to strengthen your muscles (resistance exercise), such as Pilates or lifting weights, as part of your weekly exercise routine. Try to do these types of exercises for 30 minutes at least 3 days a week. Do not use any products that contain nicotine or tobacco. These products include cigarettes, chewing tobacco, and vaping devices, such as e-cigarettes. If you need help quitting, ask your health care provider. Monitor your blood pressure at home as told by your health care provider. Keep all follow-up visits. This is important. Medicines Take over-the-counter and prescription medicines only as told by your health care provider. Follow directions carefully. Blood  pressure medicines must be taken as prescribed. Do not skip doses of blood pressure medicine. Doing this puts you at risk for problems and can make the medicine less effective. Ask your health care provider about side effects or reactions to medicines that you should watch for. Contact a health care provider if you: Think you are having a reaction to a medicine you are taking. Have headaches that keep coming back (recurring). Feel dizzy. Have swelling in your ankles. Have trouble with your vision. Get help right away if you: Develop a severe headache or confusion. Have unusual weakness or numbness. Feel faint. Have severe pain in your chest or abdomen. Vomit repeatedly. Have trouble breathing. These symptoms may be an emergency. Get help right away. Call 911. Do not wait to see if the symptoms will go away. Do not drive yourself to the hospital. Summary Hypertension is when the force of blood pumping through your arteries is too strong. If this condition is not controlled, it may put you at risk for serious complications. Your personal target blood pressure may vary depending on your medical conditions, your age, and other factors. For most people, a normal blood pressure is less than 120/80. Hypertension is treated with lifestyle changes, medicines, or a combination of both. Lifestyle changes include losing weight, eating a healthy,   low-sodium diet, exercising more, and limiting alcohol. This information is not intended to replace advice given to you by your health care provider. Make sure you discuss any questions you have with your health care provider. Document Revised: 03/11/2021 Document Reviewed: 03/11/2021 Elsevier Patient Education  2023 Elsevier Inc.  

## 2022-07-25 LAB — CMP14+EGFR
ALT: 21 IU/L (ref 0–44)
AST: 20 IU/L (ref 0–40)
Albumin/Globulin Ratio: 1.6 (ref 1.2–2.2)
Albumin: 4.7 g/dL (ref 3.7–4.7)
Alkaline Phosphatase: 100 IU/L (ref 44–121)
BUN/Creatinine Ratio: 18 (ref 10–24)
BUN: 21 mg/dL (ref 8–27)
Bilirubin Total: 0.7 mg/dL (ref 0.0–1.2)
CO2: 24 mmol/L (ref 20–29)
Calcium: 9.9 mg/dL (ref 8.6–10.2)
Chloride: 98 mmol/L (ref 96–106)
Creatinine, Ser: 1.17 mg/dL (ref 0.76–1.27)
Globulin, Total: 2.9 g/dL (ref 1.5–4.5)
Glucose: 98 mg/dL (ref 70–99)
Potassium: 4.5 mmol/L (ref 3.5–5.2)
Sodium: 138 mmol/L (ref 134–144)
Total Protein: 7.6 g/dL (ref 6.0–8.5)
eGFR: 61 mL/min/{1.73_m2} (ref >59)

## 2022-08-27 ENCOUNTER — Encounter: Payer: Self-pay | Admitting: Family

## 2022-08-27 ENCOUNTER — Ambulatory Visit (INDEPENDENT_AMBULATORY_CARE_PROVIDER_SITE_OTHER): Payer: Medicare Other | Admitting: Family

## 2022-08-27 VITALS — BP 190/73 | HR 72 | Temp 97.5°F | Ht 71.0 in | Wt 243.0 lb

## 2022-08-27 DIAGNOSIS — I10 Essential (primary) hypertension: Secondary | ICD-10-CM | POA: Diagnosis not present

## 2022-08-27 DIAGNOSIS — I739 Peripheral vascular disease, unspecified: Secondary | ICD-10-CM | POA: Diagnosis not present

## 2022-08-27 MED ORDER — AMLODIPINE BESYLATE 5 MG PO TABS
5.0000 mg | ORAL_TABLET | Freq: Every day | ORAL | 1 refills | Status: DC
Start: 2022-08-27 — End: 2022-09-11

## 2022-08-27 NOTE — Patient Instructions (Signed)
Hypertension, Adult High blood pressure (hypertension) is when the force of blood pumping through the arteries is too strong. The arteries are the blood vessels that carry blood from the heart throughout the body. Hypertension forces the heart to work harder to pump blood and may cause arteries to become narrow or stiff. Untreated or uncontrolled hypertension can lead to a heart attack, heart failure, a stroke, kidney disease, and other problems. A blood pressure reading consists of a higher number over a lower number. Ideally, your blood pressure should be below 120/80. The first ("top") number is called the systolic pressure. It is a measure of the pressure in your arteries as your heart beats. The second ("bottom") number is called the diastolic pressure. It is a measure of the pressure in your arteries as the heart relaxes. What are the causes? The exact cause of this condition is not known. There are some conditions that result in high blood pressure. What increases the risk? Certain factors may make you more likely to develop high blood pressure. Some of these risk factors are under your control, including: Smoking. Not getting enough exercise or physical activity. Being overweight. Having too much fat, sugar, calories, or salt (sodium) in your diet. Drinking too much alcohol. Other risk factors include: Having a personal history of heart disease, diabetes, high cholesterol, or kidney disease. Stress. Having a family history of high blood pressure and high cholesterol. Having obstructive sleep apnea. Age. The risk increases with age. What are the signs or symptoms? High blood pressure may not cause symptoms. Very high blood pressure (hypertensive crisis) may cause: Headache. Fast or irregular heartbeats (palpitations). Shortness of breath. Nosebleed. Nausea and vomiting. Vision changes. Severe chest pain, dizziness, and seizures. How is this diagnosed? This condition is diagnosed by  measuring your blood pressure while you are seated, with your arm resting on a flat surface, your legs uncrossed, and your feet flat on the floor. The cuff of the blood pressure monitor will be placed directly against the skin of your upper arm at the level of your heart. Blood pressure should be measured at least twice using the same arm. Certain conditions can cause a difference in blood pressure between your right and left arms. If you have a high blood pressure reading during one visit or you have normal blood pressure with other risk factors, you may be asked to: Return on a different day to have your blood pressure checked again. Monitor your blood pressure at home for 1 week or longer. If you are diagnosed with hypertension, you may have other blood or imaging tests to help your health care provider understand your overall risk for other conditions. How is this treated? This condition is treated by making healthy lifestyle changes, such as eating healthy foods, exercising more, and reducing your alcohol intake. You may be referred for counseling on a healthy diet and physical activity. Your health care provider may prescribe medicine if lifestyle changes are not enough to get your blood pressure under control and if: Your systolic blood pressure is above 130. Your diastolic blood pressure is above 80. Your personal target blood pressure may vary depending on your medical conditions, your age, and other factors. Follow these instructions at home: Eating and drinking  Eat a diet that is high in fiber and potassium, and low in sodium, added sugar, and fat. An example of this eating plan is called the DASH diet. DASH stands for Dietary Approaches to Stop Hypertension. To eat this way: Eat   plenty of fresh fruits and vegetables. Try to fill one half of your plate at each meal with fruits and vegetables. Eat whole grains, such as whole-wheat pasta, brown rice, or whole-grain bread. Fill about one  fourth of your plate with whole grains. Eat or drink low-fat dairy products, such as skim milk or low-fat yogurt. Avoid fatty cuts of meat, processed or cured meats, and poultry with skin. Fill about one fourth of your plate with lean proteins, such as fish, chicken without skin, beans, eggs, or tofu. Avoid pre-made and processed foods. These tend to be higher in sodium, added sugar, and fat. Reduce your daily sodium intake. Many people with hypertension should eat less than 1,500 mg of sodium a day. Do not drink alcohol if: Your health care provider tells you not to drink. You are pregnant, may be pregnant, or are planning to become pregnant. If you drink alcohol: Limit how much you have to: 0-1 drink a day for women. 0-2 drinks a day for men. Know how much alcohol is in your drink. In the [place] one drink equals one 12 oz bottle of beer (355 mL), one 5 oz glass of wine (148 mL), or one 1 oz glass of hard liquor (44 mL). Lifestyle  Work with your health care provider to maintain a healthy body weight or to lose weight. Ask what an ideal weight is for you. Get at least 30 minutes of exercise that causes your heart to beat faster (aerobic exercise) most days of the week. Activities may include walking, swimming, or biking. Include exercise to strengthen your muscles (resistance exercise), such as Pilates or lifting weights, as part of your weekly exercise routine. Try to do these types of exercises for 30 minutes at least 3 days a week. Do not use any products that contain nicotine or tobacco. These products include cigarettes, chewing tobacco, and vaping devices, such as e-cigarettes. If you need help quitting, ask your health care provider. Monitor your blood pressure at home as told by your health care provider. Keep all follow-up visits. This is important. Medicines Take over-the-counter and prescription medicines only as told by your health care provider. Follow directions carefully. Blood  pressure medicines must be taken as prescribed. Do not skip doses of blood pressure medicine. Doing this puts you at risk for problems and can make the medicine less effective. Ask your health care provider about side effects or reactions to medicines that you should watch for. Contact a health care provider if you: Think you are having a reaction to a medicine you are taking. Have headaches that keep coming back (recurring). Feel dizzy. Have swelling in your ankles. Have trouble with your vision. Get help right away if you: Develop a severe headache or confusion. Have unusual weakness or numbness. Feel faint. Have severe pain in your chest or abdomen. Vomit repeatedly. Have trouble breathing. These symptoms may be an emergency. Get help right away. Call 911. Do not wait to see if the symptoms will go away. Do not drive yourself to the hospital. Summary Hypertension is when the force of blood pumping through your arteries is too strong. If this condition is not controlled, it may put you at risk for serious complications. Your personal target blood pressure may vary depending on your medical conditions, your age, and other factors. For most people, a normal blood pressure is less than 120/80. Hypertension is treated with lifestyle changes, medicines, or a combination of both. Lifestyle changes include losing weight, eating a healthy,   low-sodium diet, exercising more, and limiting alcohol. This information is not intended to replace advice given to you by your health care provider. Make sure you discuss any questions you have with your health care provider. Document Revised: 03/11/2021 Document Reviewed: 03/11/2021 Elsevier Patient Education  2023 Elsevier Inc.  

## 2022-08-27 NOTE — Progress Notes (Signed)
Subjective:    Patient ID: Jeffery Suarez, male    DOB: 1935/06/18, 87 y.o.   MRN: [id number]  Chief Complaint  Patient presents with   Hypertension   Pt presents to the office today for chronic follow up.  He was restarted on irbesartan 300 mg. His BP is not at goal today.    He has PVD.  Hypertension This is a chronic problem. The current episode started more than 1 year ago. The problem is unchanged. The problem is uncontrolled. Pertinent negatives include no malaise/fatigue, peripheral edema or shortness of breath. Risk factors for coronary artery disease include dyslipidemia, obesity and male gender. Past treatments include angiotensin blockers. The current treatment provides no improvement.      Review of Systems  Constitutional:  Negative for malaise/fatigue.  Respiratory:  Negative for shortness of breath.   All other systems reviewed and are negative.      Objective:   Physical Exam Vitals reviewed.  Constitutional:      General: He is not in acute distress.    Appearance: He is well-developed.  HENT:     Head: Normocephalic.     Right Ear: Tympanic membrane normal.     Left Ear: Tympanic membrane normal.  Eyes:     General:        Right eye: No discharge.        Left eye: No discharge.     Pupils: Pupils are equal, round, and reactive to light.  Neck:     Thyroid: No thyromegaly.  Cardiovascular:     Rate and Rhythm: Normal rate and regular rhythm.     Heart sounds: No murmur heard. Pulmonary:     Effort: Pulmonary effort is normal. No respiratory distress.     Breath sounds: Normal breath sounds. No wheezing.  Abdominal:     General: Bowel sounds are normal. There is no distension.     Palpations: Abdomen is soft.     Tenderness: There is no abdominal tenderness.  Musculoskeletal:        General: No tenderness. Normal range of motion.     Cervical back: Normal range of motion and neck supple.     Right lower leg: Edema present.     Left lower leg:  Edema present.  Skin:    General: Skin is warm and dry.     Findings: No erythema or rash.  Neurological:     Mental Status: He is alert and oriented to person, place, and time.     Cranial Nerves: No cranial nerve deficit.     Deep Tendon Reflexes: Reflexes are normal and symmetric.  Psychiatric:        Behavior: Behavior normal.        Thought Content: Thought content normal.        Judgment: Judgment normal.      BP (!) 194/71   Pulse 72   Temp (!) 97.5 F (36.4 C) (Temporal)   Ht 5\' 11"  (1.803 m)   Wt 243 lb (110.2 kg)   SpO2 98%   BMI 33.89 kg/m       Assessment & Plan:  [name] comes in today with chief complaint of Hypertension   Diagnosis and orders addressed:  1. Primary hypertension Norvasc 5 g  Continue irbesartan 300 mg  -Daily blood pressure log given with instructions on how to fill out and told to bring to next visit -Dash diet information given -Exercise encouraged - Stress Management  -Continue current meds -  RTO in 3 weeks - amLODipine (NORVASC) 5 MG tablet; Take 1 tablet (5 mg total) by mouth daily.  Dispense: 90 tablet; Refill: 1 - BMP8+EGFR  2. Peripheral vascular disease - BMP8+EGFR   Labs pending Health Maintenance reviewed Diet and exercise encouraged  Follow up plan: 3 weeks   Jannifer Rodney, FNP

## 2022-08-28 LAB — BMP8+EGFR
BUN/Creatinine Ratio: 20 (ref 10–24)
BUN: 21 mg/dL (ref 8–27)
CO2: 23 mmol/L (ref 20–29)
Calcium: 9.8 mg/dL (ref 8.6–10.2)
Chloride: 99 mmol/L (ref 96–106)
Creatinine, Ser: 1.04 mg/dL (ref 0.76–1.27)
Glucose: 97 mg/dL (ref 70–99)
Potassium: 4.4 mmol/L (ref 3.5–5.2)
Sodium: 138 mmol/L (ref 134–144)
eGFR: 70 mL/min/{1.73_m2} (ref >59)

## 2022-09-11 ENCOUNTER — Encounter: Payer: Self-pay | Admitting: Family

## 2022-09-11 ENCOUNTER — Ambulatory Visit (INDEPENDENT_AMBULATORY_CARE_PROVIDER_SITE_OTHER): Payer: Medicare Other | Admitting: Family

## 2022-09-11 VITALS — BP 151/63 | HR 78 | Ht 71.0 in | Wt 243.0 lb

## 2022-09-11 DIAGNOSIS — E785 Hyperlipidemia, unspecified: Secondary | ICD-10-CM

## 2022-09-11 DIAGNOSIS — Z6833 Body mass index (BMI) 33.0-33.9, adult: Secondary | ICD-10-CM

## 2022-09-11 DIAGNOSIS — I10 Essential (primary) hypertension: Secondary | ICD-10-CM | POA: Diagnosis not present

## 2022-09-11 DIAGNOSIS — I739 Peripheral vascular disease, unspecified: Secondary | ICD-10-CM | POA: Diagnosis not present

## 2022-09-11 DIAGNOSIS — E669 Obesity, unspecified: Secondary | ICD-10-CM

## 2022-09-11 MED ORDER — IRBESARTAN 300 MG PO TABS
300.0000 mg | ORAL_TABLET | Freq: Every day | ORAL | 1 refills | Status: DC
Start: 2022-09-11 — End: 2023-04-12

## 2022-09-11 MED ORDER — AMLODIPINE BESYLATE 10 MG PO TABS
10.0000 mg | ORAL_TABLET | Freq: Every day | ORAL | 1 refills | Status: DC
Start: 2022-09-11 — End: 2023-04-12

## 2022-09-11 MED ORDER — ROSUVASTATIN CALCIUM 10 MG PO TABS: 10.0000 mg | ORAL_TABLET | Freq: Every day | ORAL | 1 refills | Status: DC

## 2022-09-11 NOTE — Progress Notes (Signed)
Subjective:    Patient ID: Jeffery Suarez, male    DOB: [birth year], 87 y.o.   MRN: [id number]  Chief Complaint  Patient presents with   Hypertension   Pt presents to the office today to follow up on HTN. He was seen on 07/24/22 and restarted on his irbesartan 300 mg and then seen on 08/27/22 and started on Norvasc 5 mg. His BP today is improved, but not at goal.   Has PVD.  Hypertension This is a chronic problem. The current episode started more than 1 year ago. The problem has been waxing and waning since onset. The problem is uncontrolled. Pertinent negatives include no malaise/fatigue, peripheral edema or shortness of breath. Risk factors for coronary artery disease include dyslipidemia, obesity and male gender. Past treatments include angiotensin blockers and calcium channel blockers. The current treatment provides mild improvement.  Hyperlipidemia This is a chronic problem. The current episode started more than 1 year ago. Exacerbating diseases include obesity. Pertinent negatives include no shortness of breath. Current antihyperlipidemic treatment includes statins. The current treatment provides moderate improvement of lipids. Risk factors for coronary artery disease include dyslipidemia, hypertension and a sedentary lifestyle.      Review of Systems  Constitutional:  Negative for malaise/fatigue.  Respiratory:  Negative for shortness of breath.   All other systems reviewed and are negative.      Objective:   Physical Exam Vitals reviewed.  Constitutional:      General: He is not in acute distress.    Appearance: He is well-developed. He is obese.  HENT:     Head: Normocephalic.     Right Ear: Tympanic membrane normal.     Left Ear: Tympanic membrane normal.  Eyes:     General:        Right eye: No discharge.        Left eye: No discharge.     Pupils: Pupils are equal, round, and reactive to light.  Neck:     Thyroid: No thyromegaly.  Cardiovascular:     Rate and  Rhythm: Normal rate and regular rhythm.     Heart sounds: Normal heart sounds. No murmur heard. Pulmonary:     Effort: Pulmonary effort is normal. No respiratory distress.     Breath sounds: Normal breath sounds. No wheezing.  Abdominal:     General: Bowel sounds are normal. There is no distension.     Palpations: Abdomen is soft.     Tenderness: There is no abdominal tenderness.  Musculoskeletal:        General: No tenderness. Normal range of motion.     Cervical back: Normal range of motion and neck supple.  Skin:    General: Skin is warm and dry.     Findings: No erythema or rash.  Neurological:     Mental Status: He is alert and oriented to person, place, and time.     Cranial Nerves: No cranial nerve deficit.     Deep Tendon Reflexes: Reflexes are normal and symmetric.  Psychiatric:        Behavior: Behavior normal.        Thought Content: Thought content normal.        Judgment: Judgment normal.     BP (!) 151/63   Pulse 78   Ht 5\' 11"  (1.803 m)   Wt 243 lb (110.2 kg)   SpO2 98%   BMI 33.89 kg/m      Assessment & Plan:  Jeffery Suarez comes in today with  chief complaint of Hypertension   Diagnosis and orders addressed:  1. Primary hypertension Will increase Norvasc to 10 mg from 5 mg  -Dash diet information given -Exercise encouraged - Stress Management  -Continue current meds -RTO in 1 month  - amLODipine (NORVASC) 10 MG tablet; Take 1 tablet (10 mg total) by mouth daily.  Dispense: 90 tablet; Refill: 1 - irbesartan (AVAPRO) 300 MG tablet; Take 1 tablet (300 mg total) by mouth daily.  Dispense: 90 tablet; Refill: 1  2. Peripheral vascular disease (HCC) - rosuvastatin (CRESTOR) 10 MG tablet; Take 1 tablet (10 mg total) by mouth daily.  Dispense: 90 tablet; Refill: 1  3. Obesity (BMI 30-39.9)   5. Hyperlipidemia, unspecified hyperlipidemia type - rosuvastatin (CRESTOR) 10 MG tablet; Take 1 tablet (10 mg total) by mouth daily.  Dispense: 90 tablet;  Refill: 1   Labs pending Health Maintenance reviewed Diet and exercise encouraged  Follow up plan: 1 month    Jannifer Rodney, FNP

## 2022-09-11 NOTE — Patient Instructions (Signed)
Hypertension, Adult High blood pressure (hypertension) is when the force of blood pumping through the arteries is too strong. The arteries are the blood vessels that carry blood from the heart throughout the body. Hypertension forces the heart to work harder to pump blood and may cause arteries to become narrow or stiff. Untreated or uncontrolled hypertension can lead to a heart attack, heart failure, a stroke, kidney disease, and other problems. A blood pressure reading consists of a higher number over a lower number. Ideally, your blood pressure should be below 120/80. The first ("top") number is called the systolic pressure. It is a measure of the pressure in your arteries as your heart beats. The second ("bottom") number is called the diastolic pressure. It is a measure of the pressure in your arteries as the heart relaxes. What are the causes? The exact cause of this condition is not known. There are some conditions that result in high blood pressure. What increases the risk? Certain factors may make you more likely to develop high blood pressure. Some of these risk factors are under your control, including: Smoking. Not getting enough exercise or physical activity. Being overweight. Having too much fat, sugar, calories, or salt (sodium) in your diet. Drinking too much alcohol. Other risk factors include: Having a personal history of heart disease, diabetes, high cholesterol, or kidney disease. Stress. Having a family history of high blood pressure and high cholesterol. Having obstructive sleep apnea. Age. The risk increases with age. What are the signs or symptoms? High blood pressure may not cause symptoms. Very high blood pressure (hypertensive crisis) may cause: Headache. Fast or irregular heartbeats (palpitations). Shortness of breath. Nosebleed. Nausea and vomiting. Vision changes. Severe chest pain, dizziness, and seizures. How is this diagnosed? This condition is diagnosed by  measuring your blood pressure while you are seated, with your arm resting on a flat surface, your legs uncrossed, and your feet flat on the floor. The cuff of the blood pressure monitor will be placed directly against the skin of your upper arm at the level of your heart. Blood pressure should be measured at least twice using the same arm. Certain conditions can cause a difference in blood pressure between your right and left arms. If you have a high blood pressure reading during one visit or you have normal blood pressure with other risk factors, you may be asked to: Return on a different day to have your blood pressure checked again. Monitor your blood pressure at home for 1 week or longer. If you are diagnosed with hypertension, you may have other blood or imaging tests to help your health care provider understand your overall risk for other conditions. How is this treated? This condition is treated by making healthy lifestyle changes, such as eating healthy foods, exercising more, and reducing your alcohol intake. You may be referred for counseling on a healthy diet and physical activity. Your health care provider may prescribe medicine if lifestyle changes are not enough to get your blood pressure under control and if: Your systolic blood pressure is above 130. Your diastolic blood pressure is above 80. Your personal target blood pressure may vary depending on your medical conditions, your age, and other factors. Follow these instructions at home: Eating and drinking  Eat a diet that is high in fiber and potassium, and low in sodium, added sugar, and fat. An example of this eating plan is called the DASH diet. DASH stands for Dietary Approaches to Stop Hypertension. To eat this way: Eat   plenty of fresh fruits and vegetables. Try to fill one half of your plate at each meal with fruits and vegetables. Eat whole grains, such as whole-wheat pasta, brown rice, or whole-grain bread. Fill about one  fourth of your plate with whole grains. Eat or drink low-fat dairy products, such as skim milk or low-fat yogurt. Avoid fatty cuts of meat, processed or cured meats, and poultry with skin. Fill about one fourth of your plate with lean proteins, such as fish, chicken without skin, beans, eggs, or tofu. Avoid pre-made and processed foods. These tend to be higher in sodium, added sugar, and fat. Reduce your daily sodium intake. Many people with hypertension should eat less than 1,500 mg of sodium a day. Do not drink alcohol if: Your health care provider tells you not to drink. You are pregnant, may be pregnant, or are planning to become pregnant. If you drink alcohol: Limit how much you have to: 0-1 drink a day for women. 0-2 drinks a day for men. Know how much alcohol is in your drink. In the [place] one drink equals one 12 oz bottle of beer (355 mL), one 5 oz glass of wine (148 mL), or one 1 oz glass of hard liquor (44 mL). Lifestyle  Work with your health care provider to maintain a healthy body weight or to lose weight. Ask what an ideal weight is for you. Get at least 30 minutes of exercise that causes your heart to beat faster (aerobic exercise) most days of the week. Activities may include walking, swimming, or biking. Include exercise to strengthen your muscles (resistance exercise), such as Pilates or lifting weights, as part of your weekly exercise routine. Try to do these types of exercises for 30 minutes at least 3 days a week. Do not use any products that contain nicotine or tobacco. These products include cigarettes, chewing tobacco, and vaping devices, such as e-cigarettes. If you need help quitting, ask your health care provider. Monitor your blood pressure at home as told by your health care provider. Keep all follow-up visits. This is important. Medicines Take over-the-counter and prescription medicines only as told by your health care provider. Follow directions carefully. Blood  pressure medicines must be taken as prescribed. Do not skip doses of blood pressure medicine. Doing this puts you at risk for problems and can make the medicine less effective. Ask your health care provider about side effects or reactions to medicines that you should watch for. Contact a health care provider if you: Think you are having a reaction to a medicine you are taking. Have headaches that keep coming back (recurring). Feel dizzy. Have swelling in your ankles. Have trouble with your vision. Get help right away if you: Develop a severe headache or confusion. Have unusual weakness or numbness. Feel faint. Have severe pain in your chest or abdomen. Vomit repeatedly. Have trouble breathing. These symptoms may be an emergency. Get help right away. Call 911. Do not wait to see if the symptoms will go away. Do not drive yourself to the hospital. Summary Hypertension is when the force of blood pumping through your arteries is too strong. If this condition is not controlled, it may put you at risk for serious complications. Your personal target blood pressure may vary depending on your medical conditions, your age, and other factors. For most people, a normal blood pressure is less than 120/80. Hypertension is treated with lifestyle changes, medicines, or a combination of both. Lifestyle changes include losing weight, eating a healthy,   low-sodium diet, exercising more, and limiting alcohol. This information is not intended to replace advice given to you by your health care provider. Make sure you discuss any questions you have with your health care provider. Document Revised: 03/11/2021 Document Reviewed: 03/11/2021 Elsevier Patient Education  2023 Elsevier Inc.  

## 2022-10-16 ENCOUNTER — Ambulatory Visit (INDEPENDENT_AMBULATORY_CARE_PROVIDER_SITE_OTHER): Payer: Medicare Other | Admitting: Family

## 2022-10-16 ENCOUNTER — Encounter: Payer: Self-pay | Admitting: Family

## 2022-10-16 VITALS — BP 126/63 | HR 81 | Temp 97.8°F | Ht 71.0 in | Wt 246.0 lb

## 2022-10-16 DIAGNOSIS — I739 Peripheral vascular disease, unspecified: Secondary | ICD-10-CM | POA: Diagnosis not present

## 2022-10-16 DIAGNOSIS — E785 Hyperlipidemia, unspecified: Secondary | ICD-10-CM

## 2022-10-16 DIAGNOSIS — I10 Essential (primary) hypertension: Secondary | ICD-10-CM | POA: Diagnosis not present

## 2022-10-16 NOTE — Progress Notes (Signed)
Subjective:    Patient ID: Jeffery Suarez, male    DOB: [birth year], 87 y.o.   MRN: [id number]  Chief Complaint  Patient presents with   Hypertension   Pt presents to the office today to follow up on HTN. He was seen on 07/24/22 and restarted on his irbesartan 300 mg and then seen on 08/27/22 and started on Norvasc 5 mg then seen on 04/26/2 and we increased his Norvasc to 10 mg. His BP today is improved, but not at goal.    Has PVD.  Hypertension This is a chronic problem. The current episode started more than 1 year ago. The problem has been resolved since onset. The problem is controlled. Pertinent negatives include no malaise/fatigue, peripheral edema or shortness of breath. Risk factors for coronary artery disease include obesity and dyslipidemia. The current treatment provides moderate improvement.  Hyperlipidemia This is a chronic problem. The current episode started more than 1 year ago. Exacerbating diseases include obesity. Pertinent negatives include no shortness of breath. Current antihyperlipidemic treatment includes statins. The current treatment provides moderate improvement of lipids.      Review of Systems  Constitutional:  Negative for malaise/fatigue.  Respiratory:  Negative for shortness of breath.   All other systems reviewed and are negative.      Objective:   Physical Exam Vitals reviewed.  Constitutional:      General: He is not in acute distress.    Appearance: He is well-developed. He is obese.  HENT:     Head: Normocephalic.     Right Ear: External ear normal.     Left Ear: External ear normal.  Eyes:     General:        Right eye: No discharge.        Left eye: No discharge.     Pupils: Pupils are equal, round, and reactive to light.  Neck:     Thyroid: No thyromegaly.  Cardiovascular:     Rate and Rhythm: Normal rate and regular rhythm.     Heart sounds: Normal heart sounds. No murmur heard. Pulmonary:     Effort: Pulmonary effort is normal.  No respiratory distress.     Breath sounds: Normal breath sounds. No wheezing.  Abdominal:     General: Bowel sounds are normal. There is no distension.     Palpations: Abdomen is soft.     Tenderness: There is no abdominal tenderness.  Musculoskeletal:        General: No tenderness. Normal range of motion.     Cervical back: Normal range of motion and neck supple.  Skin:    General: Skin is warm and dry.     Findings: No erythema or rash.  Neurological:     Mental Status: He is alert and oriented to person, place, and time.     Cranial Nerves: No cranial nerve deficit.     Deep Tendon Reflexes: Reflexes are normal and symmetric.  Psychiatric:        Behavior: Behavior normal.        Thought Content: Thought content normal.        Judgment: Judgment normal.       BP 126/63   Pulse 81   Temp 97.8 F (36.6 C) (Temporal)   Ht 5\' 11"  (1.803 m)   Wt 246 lb (111.6 kg)   BMI 34.31 kg/m      Assessment & Plan:  Jeffery Suarez comes in today with chief complaint of Hypertension   Diagnosis and  orders addressed:  1. Primary hypertension At goal today -Daily blood pressure log given with instructions on how to fill out and told to bring to next visit -Dash diet information given -Exercise encouraged - Stress Management  -Continue current meds -RTO in 4 months   2. Peripheral vascular disease (HCC)  3. Hyperlipidemia, unspecified hyperlipidemia type Continue Crestor   Labs pending Health Maintenance reviewed Diet and exercise encouraged  Follow up plan: 4 months    Jannifer Rodney, FNP

## 2022-10-16 NOTE — Patient Instructions (Signed)
Hypertension, Adult High blood pressure (hypertension) is when the force of blood pumping through the arteries is too strong. The arteries are the blood vessels that carry blood from the heart throughout the body. Hypertension forces the heart to work harder to pump blood and may cause arteries to become narrow or stiff. Untreated or uncontrolled hypertension can lead to a heart attack, heart failure, a stroke, kidney disease, and other problems. A blood pressure reading consists of a higher number over a lower number. Ideally, your blood pressure should be below 120/80. The first ("top") number is called the systolic pressure. It is a measure of the pressure in your arteries as your heart beats. The second ("bottom") number is called the diastolic pressure. It is a measure of the pressure in your arteries as the heart relaxes. What are the causes? The exact cause of this condition is not known. There are some conditions that result in high blood pressure. What increases the risk? Certain factors may make you more likely to develop high blood pressure. Some of these risk factors are under your control, including: Smoking. Not getting enough exercise or physical activity. Being overweight. Having too much fat, sugar, calories, or salt (sodium) in your diet. Drinking too much alcohol. Other risk factors include: Having a personal history of heart disease, diabetes, high cholesterol, or kidney disease. Stress. Having a family history of high blood pressure and high cholesterol. Having obstructive sleep apnea. Age. The risk increases with age. What are the signs or symptoms? High blood pressure may not cause symptoms. Very high blood pressure (hypertensive crisis) may cause: Headache. Fast or irregular heartbeats (palpitations). Shortness of breath. Nosebleed. Nausea and vomiting. Vision changes. Severe chest pain, dizziness, and seizures. How is this diagnosed? This condition is diagnosed by  measuring your blood pressure while you are seated, with your arm resting on a flat surface, your legs uncrossed, and your feet flat on the floor. The cuff of the blood pressure monitor will be placed directly against the skin of your upper arm at the level of your heart. Blood pressure should be measured at least twice using the same arm. Certain conditions can cause a difference in blood pressure between your right and left arms. If you have a high blood pressure reading during one visit or you have normal blood pressure with other risk factors, you may be asked to: Return on a different day to have your blood pressure checked again. Monitor your blood pressure at home for 1 week or longer. If you are diagnosed with hypertension, you may have other blood or imaging tests to help your health care provider understand your overall risk for other conditions. How is this treated? This condition is treated by making healthy lifestyle changes, such as eating healthy foods, exercising more, and reducing your alcohol intake. You may be referred for counseling on a healthy diet and physical activity. Your health care provider may prescribe medicine if lifestyle changes are not enough to get your blood pressure under control and if: Your systolic blood pressure is above 130. Your diastolic blood pressure is above 80. Your personal target blood pressure may vary depending on your medical conditions, your age, and other factors. Follow these instructions at home: Eating and drinking  Eat a diet that is high in fiber and potassium, and low in sodium, added sugar, and fat. An example of this eating plan is called the DASH diet. DASH stands for Dietary Approaches to Stop Hypertension. To eat this way: Eat   plenty of fresh fruits and vegetables. Try to fill one half of your plate at each meal with fruits and vegetables. Eat whole grains, such as whole-wheat pasta, brown rice, or whole-grain bread. Fill about one  fourth of your plate with whole grains. Eat or drink low-fat dairy products, such as skim milk or low-fat yogurt. Avoid fatty cuts of meat, processed or cured meats, and poultry with skin. Fill about one fourth of your plate with lean proteins, such as fish, chicken without skin, beans, eggs, or tofu. Avoid pre-made and processed foods. These tend to be higher in sodium, added sugar, and fat. Reduce your daily sodium intake. Many people with hypertension should eat less than 1,500 mg of sodium a day. Do not drink alcohol if: Your health care provider tells you not to drink. You are pregnant, may be pregnant, or are planning to become pregnant. If you drink alcohol: Limit how much you have to: 0-1 drink a day for women. 0-2 drinks a day for men. Know how much alcohol is in your drink. In the [place] one drink equals one 12 oz bottle of beer (355 mL), one 5 oz glass of wine (148 mL), or one 1 oz glass of hard liquor (44 mL). Lifestyle  Work with your health care provider to maintain a healthy body weight or to lose weight. Ask what an ideal weight is for you. Get at least 30 minutes of exercise that causes your heart to beat faster (aerobic exercise) most days of the week. Activities may include walking, swimming, or biking. Include exercise to strengthen your muscles (resistance exercise), such as Pilates or lifting weights, as part of your weekly exercise routine. Try to do these types of exercises for 30 minutes at least 3 days a week. Do not use any products that contain nicotine or tobacco. These products include cigarettes, chewing tobacco, and vaping devices, such as e-cigarettes. If you need help quitting, ask your health care provider. Monitor your blood pressure at home as told by your health care provider. Keep all follow-up visits. This is important. Medicines Take over-the-counter and prescription medicines only as told by your health care provider. Follow directions carefully. Blood  pressure medicines must be taken as prescribed. Do not skip doses of blood pressure medicine. Doing this puts you at risk for problems and can make the medicine less effective. Ask your health care provider about side effects or reactions to medicines that you should watch for. Contact a health care provider if you: Think you are having a reaction to a medicine you are taking. Have headaches that keep coming back (recurring). Feel dizzy. Have swelling in your ankles. Have trouble with your vision. Get help right away if you: Develop a severe headache or confusion. Have unusual weakness or numbness. Feel faint. Have severe pain in your chest or abdomen. Vomit repeatedly. Have trouble breathing. These symptoms may be an emergency. Get help right away. Call 911. Do not wait to see if the symptoms will go away. Do not drive yourself to the hospital. Summary Hypertension is when the force of blood pumping through your arteries is too strong. If this condition is not controlled, it may put you at risk for serious complications. Your personal target blood pressure may vary depending on your medical conditions, your age, and other factors. For most people, a normal blood pressure is less than 120/80. Hypertension is treated with lifestyle changes, medicines, or a combination of both. Lifestyle changes include losing weight, eating a healthy,   low-sodium diet, exercising more, and limiting alcohol. This information is not intended to replace advice given to you by your health care provider. Make sure you discuss any questions you have with your health care provider. Document Revised: 03/11/2021 Document Reviewed: 03/11/2021 Elsevier Patient Education  2024 [employer]  

## 2023-03-10 DIAGNOSIS — Z23 Encounter for immunization: Secondary | ICD-10-CM | POA: Diagnosis not present

## 2023-04-12 ENCOUNTER — Telehealth: Payer: Self-pay | Admitting: Family

## 2023-04-12 DIAGNOSIS — E785 Hyperlipidemia, unspecified: Secondary | ICD-10-CM

## 2023-04-12 DIAGNOSIS — I10 Essential (primary) hypertension: Secondary | ICD-10-CM

## 2023-04-12 DIAGNOSIS — I739 Peripheral vascular disease, unspecified: Secondary | ICD-10-CM

## 2023-04-12 MED ORDER — AMLODIPINE BESYLATE 10 MG PO TABS
10.0000 mg | ORAL_TABLET | Freq: Every day | ORAL | 0 refills | Status: DC
Start: 2023-04-12 — End: 2023-09-13

## 2023-04-12 MED ORDER — IRBESARTAN 300 MG PO TABS
300.0000 mg | ORAL_TABLET | Freq: Every day | ORAL | 0 refills | Status: DC
Start: 2023-04-12 — End: 2023-09-13

## 2023-04-12 MED ORDER — ROSUVASTATIN CALCIUM 10 MG PO TABS
10.0000 mg | ORAL_TABLET | Freq: Every day | ORAL | 0 refills | Status: DC
Start: 2023-04-12 — End: 2023-09-13

## 2023-04-12 NOTE — Telephone Encounter (Signed)
  Prescription Request  04/12/2023  Is this a "Controlled Substance" medicine? no  Have you seen your PCP in the last 2 weeks? no  If YES, route message to pool  -  If NO, patient needs to be scheduled for appointment.  What is the name of the medication or equipment? Amlodipine 10 mg, Rosuvastatin 10 mg- Patient is out and had to reschedule appt due to [place] being out.  Have you contacted your pharmacy to request a refill? NO   Which pharmacy would you like this sent to? [facility].   Patient notified that their request is being sent to the clinical staff for review and that they should receive a response within 2 business days.

## 2023-04-12 NOTE — Telephone Encounter (Signed)
Aware refills sent to pharmacy

## 2023-04-20 ENCOUNTER — Ambulatory Visit: Payer: Medicare Other | Admitting: Family

## 2023-05-03 ENCOUNTER — Encounter: Payer: Self-pay | Admitting: Family

## 2023-05-03 ENCOUNTER — Ambulatory Visit (INDEPENDENT_AMBULATORY_CARE_PROVIDER_SITE_OTHER): Payer: Medicare Other | Admitting: Family

## 2023-05-03 VITALS — BP 167/69 | HR 85 | Temp 98.1°F | Ht 71.0 in | Wt 249.0 lb

## 2023-05-03 DIAGNOSIS — E785 Hyperlipidemia, unspecified: Secondary | ICD-10-CM

## 2023-05-03 DIAGNOSIS — E669 Obesity, unspecified: Secondary | ICD-10-CM

## 2023-05-03 DIAGNOSIS — I739 Peripheral vascular disease, unspecified: Secondary | ICD-10-CM | POA: Diagnosis not present

## 2023-05-03 DIAGNOSIS — M169 Osteoarthritis of hip, unspecified: Secondary | ICD-10-CM

## 2023-05-03 DIAGNOSIS — I10 Essential (primary) hypertension: Secondary | ICD-10-CM | POA: Diagnosis not present

## 2023-05-03 MED ORDER — METOPROLOL SUCCINATE ER 25 MG PO TB24
25.0000 mg | ORAL_TABLET | Freq: Every day | ORAL | 3 refills | Status: DC
Start: 2023-05-03 — End: 2023-11-29

## 2023-05-03 NOTE — Patient Instructions (Signed)
Hypertension, Adult High blood pressure (hypertension) is when the force of blood pumping through the arteries is too strong. The arteries are the blood vessels that carry blood from the heart throughout the body. Hypertension forces the heart to work harder to pump blood and may cause arteries to become narrow or stiff. Untreated or uncontrolled hypertension can lead to a heart attack, heart failure, a stroke, kidney disease, and other problems. A blood pressure reading consists of a higher number over a lower number. Ideally, your blood pressure should be below 120/80. The first ("top") number is called the systolic pressure. It is a measure of the pressure in your arteries as your heart beats. The second ("bottom") number is called the diastolic pressure. It is a measure of the pressure in your arteries as the heart relaxes. What are the causes? The exact cause of this condition is not known. There are some conditions that result in high blood pressure. What increases the risk? Certain factors may make you more likely to develop high blood pressure. Some of these risk factors are under your control, including: Smoking. Not getting enough exercise or physical activity. Being overweight. Having too much fat, sugar, calories, or salt (sodium) in your diet. Drinking too much alcohol. Other risk factors include: Having a personal history of heart disease, diabetes, high cholesterol, or kidney disease. Stress. Having a family history of high blood pressure and high cholesterol. Having obstructive sleep apnea. Age. The risk increases with age. What are the signs or symptoms? High blood pressure may not cause symptoms. Very high blood pressure (hypertensive crisis) may cause: Headache. Fast or irregular heartbeats (palpitations). Shortness of breath. Nosebleed. Nausea and vomiting. Vision changes. Severe chest pain, dizziness, and seizures. How is this diagnosed? This condition is diagnosed by  measuring your blood pressure while you are seated, with your arm resting on a flat surface, your legs uncrossed, and your feet flat on the floor. The cuff of the blood pressure monitor will be placed directly against the skin of your upper arm at the level of your heart. Blood pressure should be measured at least twice using the same arm. Certain conditions can cause a difference in blood pressure between your right and left arms. If you have a high blood pressure reading during one visit or you have normal blood pressure with other risk factors, you may be asked to: Return on a different day to have your blood pressure checked again. Monitor your blood pressure at home for 1 week or longer. If you are diagnosed with hypertension, you may have other blood or imaging tests to help your health care provider understand your overall risk for other conditions. How is this treated? This condition is treated by making healthy lifestyle changes, such as eating healthy foods, exercising more, and reducing your alcohol intake. You may be referred for counseling on a healthy diet and physical activity. Your health care provider may prescribe medicine if lifestyle changes are not enough to get your blood pressure under control and if: Your systolic blood pressure is above 130. Your diastolic blood pressure is above 80. Your personal target blood pressure may vary depending on your medical conditions, your age, and other factors. Follow these instructions at home: Eating and drinking  Eat a diet that is high in fiber and potassium, and low in sodium, added sugar, and fat. An example of this eating plan is called the DASH diet. DASH stands for Dietary Approaches to Stop Hypertension. To eat this way: Eat   plenty of fresh fruits and vegetables. Try to fill one half of your plate at each meal with fruits and vegetables. Eat whole grains, such as whole-wheat pasta, brown rice, or whole-grain bread. Fill about one  fourth of your plate with whole grains. Eat or drink low-fat dairy products, such as skim milk or low-fat yogurt. Avoid fatty cuts of meat, processed or cured meats, and poultry with skin. Fill about one fourth of your plate with lean proteins, such as fish, chicken without skin, beans, eggs, or tofu. Avoid pre-made and processed foods. These tend to be higher in sodium, added sugar, and fat. Reduce your daily sodium intake. Many people with hypertension should eat less than 1,500 mg of sodium a day. Do not drink alcohol if: Your health care provider tells you not to drink. You are pregnant, may be pregnant, or are planning to become pregnant. If you drink alcohol: Limit how much you have to: 0-1 drink a day for women. 0-2 drinks a day for men. Know how much alcohol is in your drink. In the [place] one drink equals one 12 oz bottle of beer (355 mL), one 5 oz glass of wine (148 mL), or one 1 oz glass of hard liquor (44 mL). Lifestyle  Work with your health care provider to maintain a healthy body weight or to lose weight. Ask what an ideal weight is for you. Get at least 30 minutes of exercise that causes your heart to beat faster (aerobic exercise) most days of the week. Activities may include walking, swimming, or biking. Include exercise to strengthen your muscles (resistance exercise), such as Pilates or lifting weights, as part of your weekly exercise routine. Try to do these types of exercises for 30 minutes at least 3 days a week. Do not use any products that contain nicotine or tobacco. These products include cigarettes, chewing tobacco, and vaping devices, such as e-cigarettes. If you need help quitting, ask your health care provider. Monitor your blood pressure at home as told by your health care provider. Keep all follow-up visits. This is important. Medicines Take over-the-counter and prescription medicines only as told by your health care provider. Follow directions carefully. Blood  pressure medicines must be taken as prescribed. Do not skip doses of blood pressure medicine. Doing this puts you at risk for problems and can make the medicine less effective. Ask your health care provider about side effects or reactions to medicines that you should watch for. Contact a health care provider if you: Think you are having a reaction to a medicine you are taking. Have headaches that keep coming back (recurring). Feel dizzy. Have swelling in your ankles. Have trouble with your vision. Get help right away if you: Develop a severe headache or confusion. Have unusual weakness or numbness. Feel faint. Have severe pain in your chest or abdomen. Vomit repeatedly. Have trouble breathing. These symptoms may be an emergency. Get help right away. Call 911. Do not wait to see if the symptoms will go away. Do not drive yourself to the hospital. Summary Hypertension is when the force of blood pumping through your arteries is too strong. If this condition is not controlled, it may put you at risk for serious complications. Your personal target blood pressure may vary depending on your medical conditions, your age, and other factors. For most people, a normal blood pressure is less than 120/80. Hypertension is treated with lifestyle changes, medicines, or a combination of both. Lifestyle changes include losing weight, eating a healthy,   low-sodium diet, exercising more, and limiting alcohol. This information is not intended to replace advice given to you by your health care provider. Make sure you discuss any questions you have with your health care provider. Document Revised: 03/11/2021 Document Reviewed: 03/11/2021 Elsevier Patient Education  2024 [employer]  

## 2023-05-03 NOTE — Progress Notes (Signed)
Subjective:    Patient ID: Jeffery Suarez, male    DOB: 06-21-1935, 87 y.o.   MRN: [id number]  Chief Complaint  Patient presents with   Medical Management of Chronic Issues    Pt presents to the office today for chronic follow up.    His BP is elevated today.    He has PVD.  Hypertension This is a chronic problem. The current episode started more than 1 year ago. The problem has been resolved since onset. The problem is controlled. Pertinent negatives include no malaise/fatigue, peripheral edema or shortness of breath. Risk factors for coronary artery disease include dyslipidemia, obesity and male gender. The current treatment provides moderate improvement.  Hyperlipidemia This is a chronic problem. The current episode started more than 1 year ago. The problem is controlled. Recent lipid tests were reviewed and are normal. Exacerbating diseases include obesity. Pertinent negatives include no shortness of breath. Current antihyperlipidemic treatment includes statins. The current treatment provides moderate improvement of lipids. Risk factors for coronary artery disease include dyslipidemia, hypertension, male sex and a sedentary lifestyle.  Arthritis Presents for follow-up visit. He complains of pain and stiffness. Affected locations include the left hip and right hip. His pain is at a severity of 5/10.      Review of Systems  Constitutional:  Negative for malaise/fatigue.  Respiratory:  Negative for shortness of breath.   Musculoskeletal:  Positive for arthritis and stiffness.  All other systems reviewed and are negative.      Objective:   Physical Exam Vitals reviewed.  Constitutional:      General: He is not in acute distress.    Appearance: He is well-developed. He is obese.  HENT:     Head: Normocephalic.     Right Ear: There is impacted cerumen.     Left Ear: Tympanic membrane normal.     Ears:     Comments: HOH  Eyes:     General:        Right eye: No discharge.         Left eye: No discharge.     Pupils: Pupils are equal, round, and reactive to light.  Neck:     Thyroid: No thyromegaly.  Cardiovascular:     Rate and Rhythm: Normal rate and regular rhythm.     Heart sounds: Normal heart sounds. No murmur heard. Pulmonary:     Effort: Pulmonary effort is normal. No respiratory distress.     Breath sounds: Normal breath sounds. No wheezing.  Abdominal:     General: Bowel sounds are normal. There is no distension.     Palpations: Abdomen is soft.     Tenderness: There is no abdominal tenderness.  Musculoskeletal:        General: No tenderness. Normal range of motion.     Cervical back: Normal range of motion and neck supple.     Right lower leg: Edema (trace) present.     Left lower leg: Edema (trace) present.  Skin:    General: Skin is warm and dry.     Findings: No erythema or rash.  Neurological:     Mental Status: He is alert and oriented to person, place, and time.     Cranial Nerves: No cranial nerve deficit.     Deep Tendon Reflexes: Reflexes are normal and symmetric.  Psychiatric:        Behavior: Behavior normal.        Thought Content: Thought content normal.  Judgment: Judgment normal.          BP (!) 167/69   Pulse 85   Temp 98.1 F (36.7 C)   Ht 5\' 11"  (1.803 m)   Wt 249 lb (112.9 kg)   SpO2 97%   BMI 34.73 kg/m   Assessment & Plan:  Jeffery Suarez comes in today with chief complaint of Medical Management of Chronic Issues   Diagnosis and orders addressed:  1. Primary hypertension (Primary) Will add metoprolol 25 mg  -Daily blood pressure log given with instructions on how to fill out and told to bring to next visit -Dash diet information given -Exercise encouraged - Stress Management  -Continue current meds -RTO in 2 weeks  - metoprolol succinate (TOPROL-XL) 25 MG 24 hr tablet; Take 1 tablet (25 mg total) by mouth daily.  Dispense: 90 tablet; Refill: 3 - CMP14+EGFR  2. Hyperlipidemia, unspecified  hyperlipidemia type - CMP14+EGFR - Lipid panel  3. Obesity (BMI 30-39.9) - CMP14+EGFR  4. Peripheral vascular disease (HCC) - CMP14+EGFR  5. Osteoarthritis of hip, unspecified laterality, unspecified osteoarthritis type - CMP14+EGFR   Labs pending Health Maintenance reviewed Diet and exercise encouraged  Follow up plan: 2 weeks to recheck HTN  Jannifer Rodney, FNP

## 2023-05-04 LAB — CMP14+EGFR
ALT: 17 [IU]/L (ref 0–44)
AST: 18 [IU]/L (ref 0–40)
Albumin: 4.6 g/dL (ref 3.7–4.7)
Alkaline Phosphatase: 91 [IU]/L (ref 44–121)
BUN/Creatinine Ratio: 19 (ref 10–24)
BUN: 23 mg/dL (ref 8–27)
Bilirubin Total: 0.7 mg/dL (ref 0.0–1.2)
CO2: 23 mmol/L (ref 20–29)
Calcium: 9.5 mg/dL (ref 8.6–10.2)
Chloride: 103 mmol/L (ref 96–106)
Creatinine, Ser: 1.18 mg/dL (ref 0.76–1.27)
Globulin, Total: 2.6 g/dL (ref 1.5–4.5)
Glucose: 98 mg/dL (ref 70–99)
Potassium: 4.4 mmol/L (ref 3.5–5.2)
Sodium: 143 mmol/L (ref 134–144)
Total Protein: 7.2 g/dL (ref 6.0–8.5)
eGFR: 60 mL/min/{1.73_m2} (ref >59)

## 2023-05-04 LAB — LIPID PANEL
Chol/HDL Ratio: 4.3 {ratio} (ref 0.0–5.0)
Cholesterol, Total: 151 mg/dL (ref 100–199)
HDL: 35 mg/dL — ABNORMAL LOW (ref >39)
LDL Chol Calc (NIH): 83 mg/dL (ref 0–99)
Triglycerides: 193 mg/dL — ABNORMAL HIGH (ref 0–149)
VLDL Cholesterol Cal: 33 mg/dL (ref 5–40)

## 2023-05-31 ENCOUNTER — Ambulatory Visit (INDEPENDENT_AMBULATORY_CARE_PROVIDER_SITE_OTHER): Payer: Medicare Other | Admitting: Family

## 2023-05-31 ENCOUNTER — Encounter: Payer: Self-pay | Admitting: Family

## 2023-05-31 VITALS — BP 132/74 | HR 80 | Temp 97.7°F | Ht 71.0 in | Wt 246.6 lb

## 2023-05-31 DIAGNOSIS — M19041 Primary osteoarthritis, right hand: Secondary | ICD-10-CM | POA: Diagnosis not present

## 2023-05-31 DIAGNOSIS — I10 Essential (primary) hypertension: Secondary | ICD-10-CM

## 2023-05-31 DIAGNOSIS — M19042 Primary osteoarthritis, left hand: Secondary | ICD-10-CM | POA: Diagnosis not present

## 2023-05-31 DIAGNOSIS — E669 Obesity, unspecified: Secondary | ICD-10-CM

## 2023-05-31 DIAGNOSIS — E785 Hyperlipidemia, unspecified: Secondary | ICD-10-CM

## 2023-05-31 DIAGNOSIS — I739 Peripheral vascular disease, unspecified: Secondary | ICD-10-CM | POA: Diagnosis not present

## 2023-05-31 DIAGNOSIS — M169 Osteoarthritis of hip, unspecified: Secondary | ICD-10-CM | POA: Diagnosis not present

## 2023-05-31 NOTE — Patient Instructions (Signed)
 Hypertension, Adult High blood pressure (hypertension) is when the force of blood pumping through the arteries is too strong. The arteries are the blood vessels that carry blood from the heart throughout the body. Hypertension forces the heart to work harder to pump blood and may cause arteries to become narrow or stiff. Untreated or uncontrolled hypertension can lead to a heart attack, heart failure, a stroke, kidney disease, and other problems. A blood pressure reading consists of a higher number over a lower number. Ideally, your blood pressure should be below 120/80. The first ("top") number is called the systolic pressure. It is a measure of the pressure in your arteries as your heart beats. The second ("bottom") number is called the diastolic pressure. It is a measure of the pressure in your arteries as the heart relaxes. What are the causes? The exact cause of this condition is not known. There are some conditions that result in high blood pressure. What increases the risk? Certain factors may make you more likely to develop high blood pressure. Some of these risk factors are under your control, including: Smoking. Not getting enough exercise or physical activity. Being overweight. Having too much fat, sugar, calories, or salt (sodium) in your diet. Drinking too much alcohol. Other risk factors include: Having a personal history of heart disease, diabetes, high cholesterol, or kidney disease. Stress. Having a family history of high blood pressure and high cholesterol. Having obstructive sleep apnea. Age. The risk increases with age. What are the signs or symptoms? High blood pressure may not cause symptoms. Very high blood pressure (hypertensive crisis) may cause: Headache. Fast or irregular heartbeats (palpitations). Shortness of breath. Nosebleed. Nausea and vomiting. Vision changes. Severe chest pain, dizziness, and seizures. How is this diagnosed? This condition is diagnosed by  measuring your blood pressure while you are seated, with your arm resting on a flat surface, your legs uncrossed, and your feet flat on the floor. The cuff of the blood pressure monitor will be placed directly against the skin of your upper arm at the level of your heart. Blood pressure should be measured at least twice using the same arm. Certain conditions can cause a difference in blood pressure between your right and left arms. If you have a high blood pressure reading during one visit or you have normal blood pressure with other risk factors, you may be asked to: Return on a different day to have your blood pressure checked again. Monitor your blood pressure at home for 1 week or longer. If you are diagnosed with hypertension, you may have other blood or imaging tests to help your health care provider understand your overall risk for other conditions. How is this treated? This condition is treated by making healthy lifestyle changes, such as eating healthy foods, exercising more, and reducing your alcohol intake. You may be referred for counseling on a healthy diet and physical activity. Your health care provider may prescribe medicine if lifestyle changes are not enough to get your blood pressure under control and if: Your systolic blood pressure is above 130. Your diastolic blood pressure is above 80. Your personal target blood pressure may vary depending on your medical conditions, your age, and other factors. Follow these instructions at home: Eating and drinking  Eat a diet that is high in fiber and potassium, and low in sodium, added sugar, and fat. An example of this eating plan is called the DASH diet. DASH stands for Dietary Approaches to Stop Hypertension. To eat this way: Eat  plenty of fresh fruits and vegetables. Try to fill one half of your plate at each meal with fruits and vegetables. Eat whole grains, such as whole-wheat pasta, brown rice, or whole-grain bread. Fill about one  fourth of your plate with whole grains. Eat or drink low-fat dairy products, such as skim milk or low-fat yogurt. Avoid fatty cuts of meat, processed or cured meats, and poultry with skin. Fill about one fourth of your plate with lean proteins, such as fish, chicken without skin, beans, eggs, or tofu. Avoid pre-made and processed foods. These tend to be higher in sodium, added sugar, and fat. Reduce your daily sodium intake. Many people with hypertension should eat less than 1,500 mg of sodium a day. Do not drink alcohol if: Your health care provider tells you not to drink. You are pregnant, may be pregnant, or are planning to become pregnant. If you drink alcohol: Limit how much you have to: 0-1 drink a day for women. 0-2 drinks a day for men. Know how much alcohol is in your drink. In the [place] one drink equals one 12 oz bottle of beer (355 mL), one 5 oz glass of wine (148 mL), or one 1 oz glass of hard liquor (44 mL). Lifestyle  Work with your health care provider to maintain a healthy body weight or to lose weight. Ask what an ideal weight is for you. Get at least 30 minutes of exercise that causes your heart to beat faster (aerobic exercise) most days of the week. Activities may include walking, swimming, or biking. Include exercise to strengthen your muscles (resistance exercise), such as Pilates or lifting weights, as part of your weekly exercise routine. Try to do these types of exercises for 30 minutes at least 3 days a week. Do not use any products that contain nicotine or tobacco. These products include cigarettes, chewing tobacco, and vaping devices, such as e-cigarettes. If you need help quitting, ask your health care provider. Monitor your blood pressure at home as told by your health care provider. Keep all follow-up visits. This is important. Medicines Take over-the-counter and prescription medicines only as told by your health care provider. Follow directions carefully. Blood  pressure medicines must be taken as prescribed. Do not skip doses of blood pressure medicine. Doing this puts you at risk for problems and can make the medicine less effective. Ask your health care provider about side effects or reactions to medicines that you should watch for. Contact a health care provider if you: Think you are having a reaction to a medicine you are taking. Have headaches that keep coming back (recurring). Feel dizzy. Have swelling in your ankles. Have trouble with your vision. Get help right away if you: Develop a severe headache or confusion. Have unusual weakness or numbness. Feel faint. Have severe pain in your chest or abdomen. Vomit repeatedly. Have trouble breathing. These symptoms may be an emergency. Get help right away. Call 911. Do not wait to see if the symptoms will go away. Do not drive yourself to the hospital. Summary Hypertension is when the force of blood pumping through your arteries is too strong. If this condition is not controlled, it may put you at risk for serious complications. Your personal target blood pressure may vary depending on your medical conditions, your age, and other factors. For most people, a normal blood pressure is less than 120/80. Hypertension is treated with lifestyle changes, medicines, or a combination of both. Lifestyle changes include losing weight, eating a healthy,  low-sodium diet, exercising more, and limiting alcohol. This information is not intended to replace advice given to you by your health care provider. Make sure you discuss any questions you have with your health care provider. Document Revised: 03/11/2021 Document Reviewed: 03/11/2021 Elsevier Patient Education  2024 [employer].

## 2023-05-31 NOTE — Progress Notes (Signed)
 Subjective:    Patient ID: Jeffery Suarez, male    DOB: 09/12/1935, 88 y.o.   MRN: [id number]  Chief Complaint  Patient presents with   Hypertension    Pt presents to the office today for chronic follow up.    His BP is elevated today, but states his home BP this morning was 132/74.    He has PVD.  Hypertension This is a chronic problem. The current episode started more than 1 year ago. The problem has been resolved since onset. The problem is controlled. Associated symptoms include malaise/fatigue. Pertinent negatives include no peripheral edema or shortness of breath. Risk factors for coronary artery disease include dyslipidemia, obesity and male gender. Past treatments include beta blockers, calcium  channel blockers and angiotensin blockers. The current treatment provides moderate improvement.  Hyperlipidemia This is a chronic problem. The current episode started more than 1 year ago. The problem is controlled. Recent lipid tests were reviewed and are normal. Exacerbating diseases include obesity. Pertinent negatives include no shortness of breath. Current antihyperlipidemic treatment includes statins. The current treatment provides moderate improvement of lipids. Risk factors for coronary artery disease include dyslipidemia, hypertension, male sex and a sedentary lifestyle.  Arthritis Presents for follow-up visit. He complains of pain and stiffness. Affected locations include the left hip and right hip. His pain is at a severity of 7/10.      Review of Systems  Constitutional:  Positive for malaise/fatigue.  Respiratory:  Negative for shortness of breath.   Musculoskeletal:  Positive for stiffness.  All other systems reviewed and are negative.      Objective:   Physical Exam Vitals reviewed.  Constitutional:      General: He is not in acute distress.    Appearance: He is well-developed. He is obese.  HENT:     Head: Normocephalic.     Right Ear: There is impacted cerumen.      Left Ear: Tympanic membrane normal.     Ears:     Comments: HOH  Eyes:     General:        Right eye: No discharge.        Left eye: No discharge.     Pupils: Pupils are equal, round, and reactive to light.  Neck:     Thyroid: No thyromegaly.  Cardiovascular:     Rate and Rhythm: Normal rate and regular rhythm.     Heart sounds: Normal heart sounds. No murmur heard. Pulmonary:     Effort: Pulmonary effort is normal. No respiratory distress.     Breath sounds: Normal breath sounds. No wheezing.  Abdominal:     General: Bowel sounds are normal. There is no distension.     Palpations: Abdomen is soft.     Tenderness: There is no abdominal tenderness.  Musculoskeletal:        General: No tenderness.     Cervical back: Normal range of motion and neck supple.     Right lower leg: Edema (trace) present.     Left lower leg: Edema (trace) present.     Comments: Pain in bilateral hips with flexion  Skin:    General: Skin is warm and dry.     Findings: No erythema or rash.  Neurological:     Mental Status: He is alert and oriented to person, place, and time.     Cranial Nerves: No cranial nerve deficit.     Deep Tendon Reflexes: Reflexes are normal and symmetric.  Psychiatric:  Behavior: Behavior normal.        Thought Content: Thought content normal.        Judgment: Judgment normal.        BP (!) 176/68   Pulse 80   Temp 97.7 F (36.5 C) (Temporal)   Ht 5' 11 (1.803 m)   Wt 246 lb 9.6 oz (111.9 kg)   SpO2 97%   BMI 34.39 kg/m   Assessment & Plan:  Jeffery Suarez comes in today with chief complaint of Hypertension   Diagnosis and orders addressed: 1. Primary hypertension (Primary) - BMP8+EGFR  2. Peripheral vascular disease (HCC) - BMP8+EGFR  3. Hyperlipidemia, unspecified hyperlipidemia type - BMP8+EGFR  4. Obesity (BMI 30-39.9) - BMP8+EGFR  5. Osteoarthritis of hip, unspecified laterality, unspecified osteoarthritis type - BMP8+EGFR  6.  Primary osteoarthritis of both hands - BMP8+EGFR   Labs pending Continue current medications  Health Maintenance reviewed Diet and exercise encouraged  Follow up plan: 6 months   Bari Learn, FNP

## 2023-06-01 LAB — BMP8+EGFR
BUN/Creatinine Ratio: 14 (ref 10–24)
BUN: 16 mg/dL (ref 8–27)
CO2: 22 mmol/L (ref 20–29)
Calcium: 9.4 mg/dL (ref 8.6–10.2)
Chloride: 101 mmol/L (ref 96–106)
Creatinine, Ser: 1.14 mg/dL (ref 0.76–1.27)
Glucose: 107 mg/dL — ABNORMAL HIGH (ref 70–99)
Potassium: 4.6 mmol/L (ref 3.5–5.2)
Sodium: 140 mmol/L (ref 134–144)
eGFR: 63 mL/min/{1.73_m2} (ref >59)

## 2023-09-12 ENCOUNTER — Other Ambulatory Visit: Payer: Self-pay | Admitting: Family

## 2023-09-12 DIAGNOSIS — E785 Hyperlipidemia, unspecified: Secondary | ICD-10-CM

## 2023-09-12 DIAGNOSIS — I739 Peripheral vascular disease, unspecified: Secondary | ICD-10-CM

## 2023-09-13 ENCOUNTER — Other Ambulatory Visit: Payer: Self-pay | Admitting: Family

## 2023-09-13 DIAGNOSIS — I10 Essential (primary) hypertension: Secondary | ICD-10-CM

## 2023-11-29 ENCOUNTER — Ambulatory Visit (INDEPENDENT_AMBULATORY_CARE_PROVIDER_SITE_OTHER): Payer: Medicare Other | Admitting: Family

## 2023-11-29 ENCOUNTER — Encounter: Payer: Self-pay | Admitting: Family

## 2023-11-29 VITALS — BP 127/80 | HR 71 | Temp 98.0°F | Ht 71.0 in | Wt 248.0 lb

## 2023-11-29 DIAGNOSIS — M19041 Primary osteoarthritis, right hand: Secondary | ICD-10-CM

## 2023-11-29 DIAGNOSIS — M19042 Primary osteoarthritis, left hand: Secondary | ICD-10-CM | POA: Diagnosis not present

## 2023-11-29 DIAGNOSIS — E669 Obesity, unspecified: Secondary | ICD-10-CM

## 2023-11-29 DIAGNOSIS — E785 Hyperlipidemia, unspecified: Secondary | ICD-10-CM | POA: Diagnosis not present

## 2023-11-29 DIAGNOSIS — I739 Peripheral vascular disease, unspecified: Secondary | ICD-10-CM | POA: Diagnosis not present

## 2023-11-29 DIAGNOSIS — M169 Osteoarthritis of hip, unspecified: Secondary | ICD-10-CM

## 2023-11-29 DIAGNOSIS — I10 Essential (primary) hypertension: Secondary | ICD-10-CM

## 2023-11-29 LAB — CMP14+EGFR
ALT: 19 IU/L (ref 0–44)
AST: 22 IU/L (ref 0–40)
Albumin: 4.4 g/dL (ref 3.7–4.7)
Alkaline Phosphatase: 86 IU/L (ref 44–121)
BUN/Creatinine Ratio: 21 (ref 10–24)
BUN: 25 mg/dL (ref 8–27)
Bilirubin Total: 0.7 mg/dL (ref 0.0–1.2)
CO2: 20 mmol/L (ref 20–29)
Calcium: 9.7 mg/dL (ref 8.6–10.2)
Chloride: 101 mmol/L (ref 96–106)
Creatinine, Ser: 1.2 mg/dL (ref 0.76–1.27)
Globulin, Total: 2.6 g/dL (ref 1.5–4.5)
Glucose: 99 mg/dL (ref 70–99)
Potassium: 4.6 mmol/L (ref 3.5–5.2)
Sodium: 137 mmol/L (ref 134–144)
Total Protein: 7 g/dL (ref 6.0–8.5)
eGFR: 59 mL/min/1.73 — ABNORMAL LOW (ref >59)

## 2023-11-29 MED ORDER — ASPIRIN 81 MG PO TBEC: 81.0000 mg | DELAYED_RELEASE_TABLET | Freq: Every day | ORAL | 1 refills | Status: AC

## 2023-11-29 MED ORDER — IRBESARTAN 300 MG PO TABS: 300.0000 mg | ORAL_TABLET | Freq: Every day | ORAL | 1 refills | Status: AC

## 2023-11-29 MED ORDER — ROSUVASTATIN CALCIUM 10 MG PO TABS: 10.0000 mg | ORAL_TABLET | Freq: Every day | ORAL | 1 refills | Status: AC

## 2023-11-29 MED ORDER — AMLODIPINE BESYLATE 10 MG PO TABS
10.0000 mg | ORAL_TABLET | Freq: Every day | ORAL | 1 refills | Status: AC
Start: 2023-11-29 — End: ?

## 2023-11-29 MED ORDER — METOPROLOL SUCCINATE ER 25 MG PO TB24: 25.0000 mg | ORAL_TABLET | Freq: Every day | ORAL | 3 refills | Status: AC

## 2023-11-29 NOTE — Progress Notes (Signed)
 Subjective:    Patient ID: Jeffery Suarez, male    DOB: May 19, 1935, 88 y.o.   MRN: [id number]  Chief Complaint  Patient presents with   Medical Management of Chronic Issues    Pt presents to the office today for chronic follow up.    His BP is elevated today, but states his home BP this morning was 127/80.    He has PVD.  Hypertension This is a chronic problem. The current episode started more than 1 year ago. The problem has been waxing and waning since onset. The problem is controlled. Associated symptoms include malaise/fatigue. Pertinent negatives include no peripheral edema or shortness of breath. Risk factors for coronary artery disease include dyslipidemia, obesity and male gender. Past treatments include beta blockers, calcium  channel blockers and angiotensin blockers. The current treatment provides moderate improvement.  Hyperlipidemia This is a chronic problem. The current episode started more than 1 year ago. The problem is controlled. Recent lipid tests were reviewed and are normal. Exacerbating diseases include obesity. Pertinent negatives include no shortness of breath. Current antihyperlipidemic treatment includes statins. The current treatment provides moderate improvement of lipids. Risk factors for coronary artery disease include dyslipidemia, hypertension, male sex and a sedentary lifestyle.  Arthritis Presents for follow-up visit. He complains of pain and stiffness. Affected locations include the left hip and right hip. His pain is at a severity of 7/10.      Review of Systems  Constitutional:  Positive for malaise/fatigue.  Respiratory:  Negative for shortness of breath.   Musculoskeletal:  Positive for stiffness.  All other systems reviewed and are negative.  Family History  Problem Relation Age of Onset   Heart attack Brother    Cancer Sister    Hypertension Mother    Social History   Socioeconomic History   Marital status: Significant Other    Spouse  name: Not on file   Number of children: 1   Years of education: Not on file   Highest education level: Some college, no degree  Occupational History   Occupation: Retired  Tobacco Use   Smoking status: Former    Current packs/day: 0.00    Types: Cigarettes    Quit date: 05/19/1983    Years since quitting: 40.5   Smokeless tobacco: Never  Vaping Use   Vaping status: Never Used  Substance and Sexual Activity   Alcohol use: No   Drug use: No   Sexual activity: Not Currently  Other Topics Concern   Not on file  Social History Narrative   Not on file   Social Drivers of Health   Financial Resource Strain: Low Risk  (01/17/2021)   Overall Financial Resource Strain (CARDIA)    Difficulty of Paying Living Expenses: Not hard at all  Food Insecurity: No Food Insecurity (01/17/2021)   Hunger Vital Sign    Worried About Running Out of Food in the Last Year: Never true    Ran Out of Food in the Last Year: Never true  Transportation Needs: No Transportation Needs (01/17/2021)   PRAPARE - Administrator, Civil Service (Medical): No    Lack of Transportation (Non-Medical): No  Physical Activity: Inactive (01/17/2021)   Exercise Vital Sign    Days of Exercise per Week: 0 days    Minutes of Exercise per Session: 0 min  Stress: No Stress Concern Present (01/17/2021)   [employer] of Occupational Health - Occupational Stress Questionnaire    Feeling of Stress : Not at all  Social Connections: Moderately Isolated (01/17/2021)   Social Connection and Isolation Panel    Frequency of Communication with Friends and Family: More than three times a week    Frequency of Social Gatherings with Friends and Family: More than three times a week    Attends Religious Services: Never    Database administrator or Organizations: Yes    Attends Engineer, structural: More than 4 times per year    Marital Status: Never married       Objective:   Physical Exam Vitals reviewed.   Constitutional:      General: He is not in acute distress.    Appearance: He is well-developed. He is obese.  HENT:     Head: Normocephalic.     Right Ear: There is impacted cerumen.     Left Ear: There is impacted cerumen.     Ears:     Comments: HOH  Eyes:     General:        Right eye: No discharge.        Left eye: No discharge.     Pupils: Pupils are equal, round, and reactive to light.  Neck:     Thyroid: No thyromegaly.  Cardiovascular:     Rate and Rhythm: Normal rate and regular rhythm.     Heart sounds: Normal heart sounds. No murmur heard. Pulmonary:     Effort: Pulmonary effort is normal. No respiratory distress.     Breath sounds: Normal breath sounds. No wheezing.  Abdominal:     General: Bowel sounds are normal. There is no distension.     Palpations: Abdomen is soft.     Tenderness: There is no abdominal tenderness.  Musculoskeletal:        General: No tenderness.     Cervical back: Normal range of motion and neck supple.     Right lower leg: Edema (trace) present.     Left lower leg: Edema (trace) present.     Comments: Pain in bilateral hips with flexion  Skin:    General: Skin is warm and dry.     Findings: No erythema or rash.  Neurological:     Mental Status: He is alert and oriented to person, place, and time.     Cranial Nerves: No cranial nerve deficit.     Deep Tendon Reflexes: Reflexes are normal and symmetric.  Psychiatric:        Behavior: Behavior normal.        Thought Content: Thought content normal.        Judgment: Judgment normal.        BP (!) 159/67   Pulse 71   Temp 98 F (36.7 C) (Temporal)   Ht 5' 11 (1.803 m)   Wt 248 lb (112.5 kg)   SpO2 98%   BMI 34.59 kg/m   Assessment & Plan:  Jeffery Suarez comes in today with chief complaint of Medical Management of Chronic Issues   Diagnosis and orders addressed: 1. Primary hypertension (Primary) - amLODipine  (NORVASC ) 10 MG tablet; Take 1 tablet (10 mg total) by mouth  daily.  Dispense: 90 tablet; Refill: 1 - irbesartan  (AVAPRO ) 300 MG tablet; Take 1 tablet (300 mg total) by mouth daily.  Dispense: 90 tablet; Refill: 1 - metoprolol  succinate (TOPROL -XL) 25 MG 24 hr tablet; Take 1 tablet (25 mg total) by mouth daily.  Dispense: 90 tablet; Refill: 3 - CMP14+EGFR - aspirin  EC 81 MG tablet; Take 1 tablet (81 mg total)  by mouth daily. Swallow whole.  Dispense: 90 tablet; Refill: 1  2. Peripheral vascular disease (HCC) - rosuvastatin  (CRESTOR ) 10 MG tablet; Take 1 tablet (10 mg total) by mouth daily.  Dispense: 90 tablet; Refill: 1 - CMP14+EGFR - aspirin  EC 81 MG tablet; Take 1 tablet (81 mg total) by mouth daily. Swallow whole.  Dispense: 90 tablet; Refill: 1  3. Hyperlipidemia, unspecified hyperlipidemia type - rosuvastatin  (CRESTOR ) 10 MG tablet; Take 1 tablet (10 mg total) by mouth daily.  Dispense: 90 tablet; Refill: 1 - CMP14+EGFR - aspirin  EC 81 MG tablet; Take 1 tablet (81 mg total) by mouth daily. Swallow whole.  Dispense: 90 tablet; Refill: 1  4. Obesity (BMI 30-39.9) - CMP14+EGFR  5. Osteoarthritis of hip, unspecified laterality, unspecified osteoarthritis type  - CMP14+EGFR  6. Primary osteoarthritis of both hands - CMP14+EGFR  Labs pending Continue current medications  Health Maintenance reviewed Diet and exercise encouraged  Follow up plan: 6 months   Bari Learn, FNP

## 2023-11-29 NOTE — Patient Instructions (Signed)
 Hypertension, Adult High blood pressure (hypertension) is when the force of blood pumping through the arteries is too strong. The arteries are the blood vessels that carry blood from the heart throughout the body. Hypertension forces the heart to work harder to pump blood and may cause arteries to become narrow or stiff. Untreated or uncontrolled hypertension can lead to a heart attack, heart failure, a stroke, kidney disease, and other problems. A blood pressure reading consists of a higher number over a lower number. Ideally, your blood pressure should be below 120/80. The first ("top") number is called the systolic pressure. It is a measure of the pressure in your arteries as your heart beats. The second ("bottom") number is called the diastolic pressure. It is a measure of the pressure in your arteries as the heart relaxes. What are the causes? The exact cause of this condition is not known. There are some conditions that result in high blood pressure. What increases the risk? Certain factors may make you more likely to develop high blood pressure. Some of these risk factors are under your control, including: Smoking. Not getting enough exercise or physical activity. Being overweight. Having too much fat, sugar, calories, or salt (sodium) in your diet. Drinking too much alcohol. Other risk factors include: Having a personal history of heart disease, diabetes, high cholesterol, or kidney disease. Stress. Having a family history of high blood pressure and high cholesterol. Having obstructive sleep apnea. Age. The risk increases with age. What are the signs or symptoms? High blood pressure may not cause symptoms. Very high blood pressure (hypertensive crisis) may cause: Headache. Fast or irregular heartbeats (palpitations). Shortness of breath. Nosebleed. Nausea and vomiting. Vision changes. Severe chest pain, dizziness, and seizures. How is this diagnosed? This condition is diagnosed by  measuring your blood pressure while you are seated, with your arm resting on a flat surface, your legs uncrossed, and your feet flat on the floor. The cuff of the blood pressure monitor will be placed directly against the skin of your upper arm at the level of your heart. Blood pressure should be measured at least twice using the same arm. Certain conditions can cause a difference in blood pressure between your right and left arms. If you have a high blood pressure reading during one visit or you have normal blood pressure with other risk factors, you may be asked to: Return on a different day to have your blood pressure checked again. Monitor your blood pressure at home for 1 week or longer. If you are diagnosed with hypertension, you may have other blood or imaging tests to help your health care provider understand your overall risk for other conditions. How is this treated? This condition is treated by making healthy lifestyle changes, such as eating healthy foods, exercising more, and reducing your alcohol intake. You may be referred for counseling on a healthy diet and physical activity. Your health care provider may prescribe medicine if lifestyle changes are not enough to get your blood pressure under control and if: Your systolic blood pressure is above 130. Your diastolic blood pressure is above 80. Your personal target blood pressure may vary depending on your medical conditions, your age, and other factors. Follow these instructions at home: Eating and drinking  Eat a diet that is high in fiber and potassium, and low in sodium, added sugar, and fat. An example of this eating plan is called the DASH diet. DASH stands for Dietary Approaches to Stop Hypertension. To eat this way: Eat  plenty of fresh fruits and vegetables. Try to fill one half of your plate at each meal with fruits and vegetables. Eat whole grains, such as whole-wheat pasta, brown rice, or whole-grain bread. Fill about one  fourth of your plate with whole grains. Eat or drink low-fat dairy products, such as skim milk or low-fat yogurt. Avoid fatty cuts of meat, processed or cured meats, and poultry with skin. Fill about one fourth of your plate with lean proteins, such as fish, chicken without skin, beans, eggs, or tofu. Avoid pre-made and processed foods. These tend to be higher in sodium, added sugar, and fat. Reduce your daily sodium intake. Many people with hypertension should eat less than 1,500 mg of sodium a day. Do not drink alcohol if: Your health care provider tells you not to drink. You are pregnant, may be pregnant, or are planning to become pregnant. If you drink alcohol: Limit how much you have to: 0-1 drink a day for women. 0-2 drinks a day for men. Know how much alcohol is in your drink. In the [place] one drink equals one 12 oz bottle of beer (355 mL), one 5 oz glass of wine (148 mL), or one 1 oz glass of hard liquor (44 mL). Lifestyle  Work with your health care provider to maintain a healthy body weight or to lose weight. Ask what an ideal weight is for you. Get at least 30 minutes of exercise that causes your heart to beat faster (aerobic exercise) most days of the week. Activities may include walking, swimming, or biking. Include exercise to strengthen your muscles (resistance exercise), such as Pilates or lifting weights, as part of your weekly exercise routine. Try to do these types of exercises for 30 minutes at least 3 days a week. Do not use any products that contain nicotine or tobacco. These products include cigarettes, chewing tobacco, and vaping devices, such as e-cigarettes. If you need help quitting, ask your health care provider. Monitor your blood pressure at home as told by your health care provider. Keep all follow-up visits. This is important. Medicines Take over-the-counter and prescription medicines only as told by your health care provider. Follow directions carefully. Blood  pressure medicines must be taken as prescribed. Do not skip doses of blood pressure medicine. Doing this puts you at risk for problems and can make the medicine less effective. Ask your health care provider about side effects or reactions to medicines that you should watch for. Contact a health care provider if you: Think you are having a reaction to a medicine you are taking. Have headaches that keep coming back (recurring). Feel dizzy. Have swelling in your ankles. Have trouble with your vision. Get help right away if you: Develop a severe headache or confusion. Have unusual weakness or numbness. Feel faint. Have severe pain in your chest or abdomen. Vomit repeatedly. Have trouble breathing. These symptoms may be an emergency. Get help right away. Call 911. Do not wait to see if the symptoms will go away. Do not drive yourself to the hospital. Summary Hypertension is when the force of blood pumping through your arteries is too strong. If this condition is not controlled, it may put you at risk for serious complications. Your personal target blood pressure may vary depending on your medical conditions, your age, and other factors. For most people, a normal blood pressure is less than 120/80. Hypertension is treated with lifestyle changes, medicines, or a combination of both. Lifestyle changes include losing weight, eating a healthy,  low-sodium diet, exercising more, and limiting alcohol. This information is not intended to replace advice given to you by your health care provider. Make sure you discuss any questions you have with your health care provider. Document Revised: 03/11/2021 Document Reviewed: 03/11/2021 Elsevier Patient Education  2024 [employer].

## 2023-11-30 ENCOUNTER — Ambulatory Visit: Payer: Self-pay | Admitting: Family

## 2023-12-27 DIAGNOSIS — H52223 Regular astigmatism, bilateral: Secondary | ICD-10-CM | POA: Diagnosis not present

## 2023-12-27 DIAGNOSIS — H5213 Myopia, bilateral: Secondary | ICD-10-CM | POA: Diagnosis not present

## 2023-12-27 DIAGNOSIS — Z961 Presence of intraocular lens: Secondary | ICD-10-CM | POA: Diagnosis not present

## 2023-12-27 DIAGNOSIS — H3530 Unspecified macular degeneration: Secondary | ICD-10-CM | POA: Diagnosis not present

## 2023-12-27 DIAGNOSIS — H524 Presbyopia: Secondary | ICD-10-CM | POA: Diagnosis not present

## 2024-02-23 DIAGNOSIS — Z23 Encounter for immunization: Secondary | ICD-10-CM | POA: Diagnosis not present

## 2024-06-01 ENCOUNTER — Encounter: Payer: Self-pay | Admitting: Family

## 2024-06-01 ENCOUNTER — Ambulatory Visit: Payer: Self-pay | Admitting: Family
